# Patient Record
Sex: Female | Born: 1975 | Race: Black or African American | Hispanic: No | Marital: Single | Smoking: Never smoker
Health system: Southern US, Community
[De-identification: ages and names within clinical notes are randomized; demographics above are authoritative.]

## PROBLEM LIST (undated history)

## (undated) ENCOUNTER — Inpatient Hospital Stay (HOSPITAL_COMMUNITY): Payer: Self-pay

## (undated) DIAGNOSIS — R7989 Other specified abnormal findings of blood chemistry: Secondary | ICD-10-CM

## (undated) DIAGNOSIS — O039 Complete or unspecified spontaneous abortion without complication: Secondary | ICD-10-CM

## (undated) DIAGNOSIS — D219 Benign neoplasm of connective and other soft tissue, unspecified: Secondary | ICD-10-CM

## (undated) DIAGNOSIS — K602 Anal fissure, unspecified: Secondary | ICD-10-CM

## (undated) DIAGNOSIS — J45909 Unspecified asthma, uncomplicated: Secondary | ICD-10-CM

## (undated) DIAGNOSIS — F419 Anxiety disorder, unspecified: Secondary | ICD-10-CM

## (undated) DIAGNOSIS — O3432 Maternal care for cervical incompetence, second trimester: Secondary | ICD-10-CM

## (undated) DIAGNOSIS — E119 Type 2 diabetes mellitus without complications: Secondary | ICD-10-CM

## (undated) DIAGNOSIS — E282 Polycystic ovarian syndrome: Secondary | ICD-10-CM

## (undated) DIAGNOSIS — I471 Supraventricular tachycardia, unspecified: Secondary | ICD-10-CM

## (undated) DIAGNOSIS — Z8632 Personal history of gestational diabetes: Secondary | ICD-10-CM

## (undated) DIAGNOSIS — A749 Chlamydial infection, unspecified: Secondary | ICD-10-CM

## (undated) DIAGNOSIS — O4690 Antepartum hemorrhage, unspecified, unspecified trimester: Secondary | ICD-10-CM

## (undated) HISTORY — DX: Unspecified asthma, uncomplicated: J45.909

## (undated) HISTORY — DX: Anal fissure, unspecified: K60.2

## (undated) HISTORY — DX: Type 2 diabetes mellitus without complications: E11.9

## (undated) HISTORY — DX: Benign neoplasm of connective and other soft tissue, unspecified: D21.9

## (undated) HISTORY — DX: Chlamydial infection, unspecified: A74.9

## (undated) HISTORY — DX: Complete or unspecified spontaneous abortion without complication: O03.9

## (undated) HISTORY — DX: Other specified abnormal findings of blood chemistry: R79.89

## (undated) HISTORY — DX: Supraventricular tachycardia, unspecified: I47.10

## (undated) HISTORY — DX: Personal history of gestational diabetes: Z86.32

---
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
## 2001-01-22 ENCOUNTER — Emergency Department (HOSPITAL_COMMUNITY): Admission: EM | Admit: 2001-01-22 | Discharge: 2001-01-23 | Payer: Self-pay | Admitting: Emergency Medicine

## 2001-03-24 ENCOUNTER — Emergency Department (HOSPITAL_COMMUNITY): Admission: EM | Admit: 2001-03-24 | Discharge: 2001-03-24 | Payer: Self-pay | Admitting: Emergency Medicine

## 2001-05-11 ENCOUNTER — Encounter: Payer: Self-pay | Admitting: Family Medicine

## 2001-05-11 ENCOUNTER — Ambulatory Visit (HOSPITAL_COMMUNITY): Admission: RE | Admit: 2001-05-11 | Discharge: 2001-05-11 | Payer: Self-pay | Admitting: Family Medicine

## 2002-03-06 ENCOUNTER — Other Ambulatory Visit: Admission: RE | Admit: 2002-03-06 | Discharge: 2002-03-06 | Payer: Self-pay

## 2002-07-08 ENCOUNTER — Ambulatory Visit (HOSPITAL_COMMUNITY): Admission: RE | Admit: 2002-07-08 | Discharge: 2002-07-08 | Payer: Self-pay

## 2003-06-02 ENCOUNTER — Inpatient Hospital Stay (HOSPITAL_COMMUNITY): Admission: AD | Admit: 2003-06-02 | Discharge: 2003-06-02 | Payer: Self-pay | Admitting: Obstetrics and Gynecology

## 2003-06-07 ENCOUNTER — Other Ambulatory Visit: Admission: RE | Admit: 2003-06-07 | Discharge: 2003-06-07 | Payer: Self-pay | Admitting: Obstetrics and Gynecology

## 2003-08-24 ENCOUNTER — Encounter: Admission: RE | Admit: 2003-08-24 | Discharge: 2003-08-24 | Payer: Self-pay | Admitting: Gynecology

## 2003-09-06 ENCOUNTER — Inpatient Hospital Stay (HOSPITAL_COMMUNITY): Admission: AD | Admit: 2003-09-06 | Discharge: 2003-09-06 | Payer: Self-pay | Admitting: Gynecology

## 2003-10-16 ENCOUNTER — Observation Stay (HOSPITAL_COMMUNITY): Admission: AD | Admit: 2003-10-16 | Discharge: 2003-10-17 | Payer: Self-pay | Admitting: Gynecology

## 2003-10-31 DIAGNOSIS — O24419 Gestational diabetes mellitus in pregnancy, unspecified control: Secondary | ICD-10-CM

## 2003-11-18 ENCOUNTER — Inpatient Hospital Stay (HOSPITAL_COMMUNITY): Admission: AD | Admit: 2003-11-18 | Discharge: 2003-11-18 | Payer: Self-pay | Admitting: Gynecology

## 2003-11-19 ENCOUNTER — Ambulatory Visit (HOSPITAL_COMMUNITY): Admission: RE | Admit: 2003-11-19 | Discharge: 2003-11-19 | Payer: Self-pay | Admitting: Gynecology

## 2003-11-21 ENCOUNTER — Inpatient Hospital Stay (HOSPITAL_COMMUNITY): Admission: AD | Admit: 2003-11-21 | Discharge: 2003-11-24 | Payer: Self-pay | Admitting: Obstetrics and Gynecology

## 2004-02-02 ENCOUNTER — Other Ambulatory Visit: Admission: RE | Admit: 2004-02-02 | Discharge: 2004-02-02 | Payer: Self-pay | Admitting: Gynecology

## 2005-08-09 ENCOUNTER — Other Ambulatory Visit: Admission: RE | Admit: 2005-08-09 | Discharge: 2005-08-09 | Payer: Self-pay | Admitting: Gynecology

## 2006-08-12 ENCOUNTER — Other Ambulatory Visit: Admission: RE | Admit: 2006-08-12 | Discharge: 2006-08-12 | Payer: Self-pay | Admitting: Gynecology

## 2006-08-24 ENCOUNTER — Emergency Department (HOSPITAL_COMMUNITY): Admission: EM | Admit: 2006-08-24 | Discharge: 2006-08-25 | Payer: Self-pay | Admitting: Emergency Medicine

## 2006-08-26 ENCOUNTER — Emergency Department (HOSPITAL_COMMUNITY): Admission: EM | Admit: 2006-08-26 | Discharge: 2006-08-26 | Payer: Self-pay | Admitting: Emergency Medicine

## 2006-09-10 ENCOUNTER — Emergency Department (HOSPITAL_COMMUNITY): Admission: EM | Admit: 2006-09-10 | Discharge: 2006-09-11 | Payer: Self-pay | Admitting: Emergency Medicine

## 2007-07-03 DIAGNOSIS — A749 Chlamydial infection, unspecified: Secondary | ICD-10-CM

## 2007-07-03 HISTORY — DX: Chlamydial infection, unspecified: A74.9

## 2007-08-15 ENCOUNTER — Other Ambulatory Visit: Admission: RE | Admit: 2007-08-15 | Discharge: 2007-08-15 | Payer: Self-pay | Admitting: Gynecology

## 2008-07-28 ENCOUNTER — Ambulatory Visit: Payer: Self-pay | Admitting: Women's Health

## 2008-08-23 ENCOUNTER — Other Ambulatory Visit: Admission: RE | Admit: 2008-08-23 | Discharge: 2008-08-23 | Payer: Self-pay | Admitting: Gynecology

## 2008-08-23 ENCOUNTER — Ambulatory Visit: Payer: Self-pay | Admitting: Gynecology

## 2008-08-23 ENCOUNTER — Encounter: Payer: Self-pay | Admitting: Gynecology

## 2009-01-12 ENCOUNTER — Emergency Department (HOSPITAL_COMMUNITY): Admission: EM | Admit: 2009-01-12 | Discharge: 2009-01-12 | Payer: Self-pay | Admitting: Emergency Medicine

## 2009-02-25 ENCOUNTER — Ambulatory Visit: Payer: Self-pay | Admitting: Gynecology

## 2009-03-03 ENCOUNTER — Ambulatory Visit: Payer: Self-pay | Admitting: Gynecology

## 2009-08-29 ENCOUNTER — Ambulatory Visit: Payer: Self-pay | Admitting: Gynecology

## 2009-08-29 ENCOUNTER — Other Ambulatory Visit: Admission: RE | Admit: 2009-08-29 | Discharge: 2009-08-29 | Payer: Self-pay | Admitting: Gynecology

## 2010-02-21 ENCOUNTER — Ambulatory Visit: Payer: Self-pay | Admitting: Gynecology

## 2010-07-03 ENCOUNTER — Emergency Department (HOSPITAL_COMMUNITY)
Admission: EM | Admit: 2010-07-03 | Discharge: 2010-07-03 | Payer: Self-pay | Source: Home / Self Care | Admitting: Emergency Medicine

## 2010-07-13 ENCOUNTER — Ambulatory Visit
Admission: RE | Admit: 2010-07-13 | Discharge: 2010-07-13 | Payer: Self-pay | Source: Home / Self Care | Attending: Gynecology | Admitting: Gynecology

## 2010-07-14 ENCOUNTER — Ambulatory Visit
Admission: RE | Admit: 2010-07-14 | Discharge: 2010-07-14 | Payer: Self-pay | Source: Home / Self Care | Attending: Gynecology | Admitting: Gynecology

## 2010-07-22 ENCOUNTER — Encounter: Payer: Self-pay | Admitting: Obstetrics and Gynecology

## 2010-08-28 ENCOUNTER — Ambulatory Visit (INDEPENDENT_AMBULATORY_CARE_PROVIDER_SITE_OTHER): Payer: BC Managed Care – PPO | Admitting: Gynecology

## 2010-08-28 DIAGNOSIS — B373 Candidiasis of vulva and vagina: Secondary | ICD-10-CM

## 2010-08-28 DIAGNOSIS — N898 Other specified noninflammatory disorders of vagina: Secondary | ICD-10-CM

## 2010-08-28 DIAGNOSIS — R82998 Other abnormal findings in urine: Secondary | ICD-10-CM

## 2010-08-31 ENCOUNTER — Encounter (INDEPENDENT_AMBULATORY_CARE_PROVIDER_SITE_OTHER): Payer: BC Managed Care – PPO | Admitting: Gynecology

## 2010-08-31 ENCOUNTER — Inpatient Hospital Stay (HOSPITAL_COMMUNITY): Admit: 2010-08-31 | Payer: Self-pay

## 2010-08-31 ENCOUNTER — Other Ambulatory Visit: Payer: Self-pay | Admitting: Gynecology

## 2010-08-31 DIAGNOSIS — Z124 Encounter for screening for malignant neoplasm of cervix: Secondary | ICD-10-CM | POA: Insufficient documentation

## 2010-08-31 DIAGNOSIS — B373 Candidiasis of vulva and vagina: Secondary | ICD-10-CM

## 2010-08-31 DIAGNOSIS — Z01419 Encounter for gynecological examination (general) (routine) without abnormal findings: Secondary | ICD-10-CM

## 2010-09-01 ENCOUNTER — Other Ambulatory Visit (HOSPITAL_COMMUNITY)
Admission: RE | Admit: 2010-09-01 | Discharge: 2010-09-01 | Disposition: A | Discharge status: Home / Self Care | Payer: BC Managed Care – PPO | Source: Ambulatory Visit | Attending: Gynecology | Admitting: Gynecology

## 2010-09-13 ENCOUNTER — Emergency Department (HOSPITAL_COMMUNITY)
Admission: EM | Admit: 2010-09-13 | Discharge: 2010-09-13 | Disposition: A | Discharge status: Home / Self Care | Payer: BC Managed Care – PPO | Attending: Emergency Medicine | Admitting: Emergency Medicine

## 2010-09-13 DIAGNOSIS — F410 Panic disorder [episodic paroxysmal anxiety] without agoraphobia: Secondary | ICD-10-CM | POA: Insufficient documentation

## 2010-09-13 DIAGNOSIS — T50901A Poisoning by unspecified drugs, medicaments and biological substances, accidental (unintentional), initial encounter: Secondary | ICD-10-CM | POA: Insufficient documentation

## 2010-09-13 DIAGNOSIS — I498 Other specified cardiac arrhythmias: Secondary | ICD-10-CM | POA: Insufficient documentation

## 2010-09-13 DIAGNOSIS — R059 Cough, unspecified: Secondary | ICD-10-CM | POA: Insufficient documentation

## 2010-09-13 DIAGNOSIS — R062 Wheezing: Secondary | ICD-10-CM | POA: Insufficient documentation

## 2010-09-13 DIAGNOSIS — R05 Cough: Secondary | ICD-10-CM | POA: Insufficient documentation

## 2010-09-13 LAB — URINALYSIS, ROUTINE W REFLEX MICROSCOPIC
Bilirubin Urine: NEGATIVE
Glucose, UA: NEGATIVE mg/dL
Hgb urine dipstick: NEGATIVE
Ketones, ur: NEGATIVE mg/dL
Nitrite: NEGATIVE
Protein, ur: NEGATIVE mg/dL
Specific Gravity, Urine: 1.028 (ref 1.005–1.030)
Urobilinogen, UA: 0.2 mg/dL (ref 0.0–1.0)
pH: 6 (ref 5.0–8.0)

## 2010-09-13 LAB — POCT PREGNANCY, URINE: Preg Test, Ur: NEGATIVE

## 2010-10-08 LAB — BASIC METABOLIC PANEL
Creatinine, Ser: 0.79 mg/dL (ref 0.4–1.2)
GFR calc Af Amer: >60 mL/min (ref >60)
Glucose, Bld: 94 mg/dL (ref 70–99)
Potassium: 4.2 mEq/L (ref 3.5–5.1)
Sodium: 139 mEq/L (ref 135–145)

## 2010-10-08 LAB — CBC
MCHC: 32.8 g/dL (ref 30.0–36.0)
MCV: 88 fL (ref 78.0–100.0)
Platelets: 225 10*3/uL (ref 150–400)
RDW: 13 % (ref 11.5–15.5)

## 2010-10-08 LAB — DIFFERENTIAL
Basophils Relative: 1 % (ref 0–1)
Eosinophils Relative: 1 % (ref 0–5)
Lymphocytes Relative: 34 % (ref 12–46)
Lymphs Abs: 2.8 10*3/uL (ref 0.7–4.0)
Monocytes Relative: 6 % (ref 3–12)

## 2010-10-08 LAB — POCT CARDIAC MARKERS: CKMB, poc: <1 ng/mL — ABNORMAL LOW (ref 1.0–8.0)

## 2010-10-17 ENCOUNTER — Ambulatory Visit (INDEPENDENT_AMBULATORY_CARE_PROVIDER_SITE_OTHER): Payer: BC Managed Care – PPO | Admitting: Gynecology

## 2010-10-17 DIAGNOSIS — R51 Headache: Secondary | ICD-10-CM

## 2010-10-17 DIAGNOSIS — R5381 Other malaise: Secondary | ICD-10-CM

## 2010-10-17 DIAGNOSIS — N949 Unspecified condition associated with female genital organs and menstrual cycle: Secondary | ICD-10-CM

## 2010-10-17 DIAGNOSIS — N938 Other specified abnormal uterine and vaginal bleeding: Secondary | ICD-10-CM

## 2010-10-23 ENCOUNTER — Other Ambulatory Visit: Payer: BC Managed Care – PPO

## 2010-10-23 ENCOUNTER — Ambulatory Visit (INDEPENDENT_AMBULATORY_CARE_PROVIDER_SITE_OTHER): Payer: BC Managed Care – PPO | Admitting: Gynecology

## 2010-10-23 DIAGNOSIS — N920 Excessive and frequent menstruation with regular cycle: Secondary | ICD-10-CM

## 2010-10-23 DIAGNOSIS — N938 Other specified abnormal uterine and vaginal bleeding: Secondary | ICD-10-CM

## 2010-10-23 DIAGNOSIS — R1900 Intra-abdominal and pelvic swelling, mass and lump, unspecified site: Secondary | ICD-10-CM

## 2010-10-23 DIAGNOSIS — N949 Unspecified condition associated with female genital organs and menstrual cycle: Secondary | ICD-10-CM

## 2010-11-17 NOTE — H&P (Signed)
NAME:  Marie Wang, Marie Wang                        ACCOUNT NO.:  [id number]   MEDICAL RECORD NO.:  [id number]                   PATIENT TYPE:  OUT   LOCATION:  ULT                                  FACILITY:  [facility]   PHYSICIAN:  Ivor Costa. Farrel Gobble, M.D.              DATE OF BIRTH:  1976-05-23   DATE OF ADMISSION:  11/19/2003  DATE OF DISCHARGE:                                HISTORY & PHYSICAL   CHIEF COMPLAINT:  Low AFI, nonspecific abdominal pain.   HISTORY OF PRESENT ILLNESS:  The patient is a 35 year old G1 with an  estimated date of confinement of 12/11/2003 and estimated gestational age of  [redacted] weeks with the history of A1 diabetes that is well controlled and  fibroids.  The patient was admitted earlier in the pregnancy for nonspecific  right lower quadrant pain which she felt may have been related to the  fibroids; however, it has not changed.  She was noted to be contracting, at  that point, and though successfully tocolyzed her pain never got better.  Because the patient continues to have pain we had offered her an early  induction of labor after an amniocentesis for lung maturity.  The patient  actually presented to the office on 11/18/2003 for a routine OB visit and  was noted to be contracting. She was now 2 cm, 50%, minus 3 and posterior.  The patient elected to walk and return back to the office several hours  later and felt that this should make any change in her cervix.  Our original  plan was to have an induction for Sunday night after an amniocentesis and  she presented to the hospital on 05/20 in the a.m. for an amniocentesis.  She was noted, unfortunately, to have a very low AFI of 5.9, with a single  pocket at the fetal head.  The remainder of the biophysical profile was  assuring with a total score of 6/8 and minus 2 only for the fluid.  We will,  therefore, go ahead and proceed with out induction of labor without an  amniocentesis secondary to low AFI of unknown etiology.  There is no history  suspicious for rupture.   The pregnancy has been complicated by the fibroids, diabetes, and the  nonspecific right lower quadrant pain.  Her blood type is O positive; and  she is antibody negative; RPR nonreactive; rubella immune; Hepatitis B  surface antigen nonreactive; HIV nonreactive; hemoglobin electrophoresis was  normal.  Please refer to the __________.   PHYSICAL EXAMINATION:  GENERAL:  On physical examination she is a well-  appearing gravida in no acute distress.  HEART:  Her heart was regular rate.  LUNGS:  Her lungs were clear to auscultation.  ABDOMEN:  Her abdomen was gravid and nontender.  PELVIC:  Vaginal exam was 250 minus 3.  EXTREMITIES:  Trace edema.   ASSESSMENT:  1. Low amniotic fluid index at 37 weeks.  2. Gestational diabetes mellitus well controlled.   PLAN:  For induction of labor.  The patient will present in the evening of  the 22nd.  She will receive Cervidil for further priming of her cervix and  begin high-dose Pitocin in the morning.                                              Ivor Costa. Farrel Gobble, M.D.   Leda Roys  D:  11/19/2003  T:  11/19/2003  Job:  [id number]

## 2010-11-17 NOTE — Consult Note (Signed)
NAME:  Marie Wang, Marie Wang                        ACCOUNT NO.:  [id number]   MEDICAL RECORD NO.:  [id number]                   PATIENT TYPE:  MAT   LOCATION:  [facility]                                 FACILITY:  [facility]   PHYSICIAN:  Juan H. Lily Peer, M.D.             DATE OF BIRTH:  1975/09/15   DATE OF CONSULTATION:  09/06/2003  DATE OF DISCHARGE:                                   CONSULTATION   REASON FOR CONSULTATION:  The patient is a 35 year old gravida 1 para 0 at  9 and one-seventh weeks gestation who had been complaining for the past few  days of nausea, vomiting, and diarrhea and was not able to keep any food  down; she stated her temperature had been over 100 at home; and she was  asked to come in to maternity admissions.  The patient stated for several  weeks she had been treated for a urinary tract infection on and off but she  has not been able to keep her Macrobid.  When she was brought in fetal heart  tones were appreciated.  She was not having any labor.  Her temperature is  98.1 and her pulse was 90, respirations were 20, and blood pressure 113/52.  She was started on lactated Ringers 500 mL bolus followed by 200 mL/hour and  in that liter of IV fluids she was given Reglan 10 mg to help with her  nausea and vomiting and she was given an Imodium for her loose stool.   PHYSICAL EXAMINATION:  GENERAL:  The patient did not appear to be in any  acute distress.  HEENT:  Unremarkable.  NECK:  Supple, trachea midline, no carotid bruits, no thyromegaly.  LUNGS:  Clear to auscultation without rhonchi or wheezes.  HEART:  Regular rate and rhythm, no murmurs or gallops.  BREAST:  Not done.  ABDOMEN:  Soft, nontender, with no rebound or guarding.  PELVIC:  Not done.  EXTREMITIES:  Trace edema.   LABORATORY DATA:  Her comprehensive metabolic panel was normal.  Her CBC was  normal with a white blood count of 10.9; hemoglobin and hematocrit of 12.2  and 37.6 respectively; with a platelet  count of 242,000.  Her urinalysis had  7-10 wbc's and few bacteria and specific gravity was 1.025 and leukocyte  esterase was small.   Based on these findings and the fact that the patient was not able to keep  her antibiotic we went ahead since we had an IV access and gave her 3 g of  Unasyn IV and the urine was sent for culture and sensitivity. Upon  completion of her IV fluid the patient had been tolerating regular diet in  the emergency room and was prepared to be discharged home.  She was  instructed to stay on clear liquids for 24 hours, to take Imodium on a  p.r.n. basis, and she was given a prescription for Reglan 10 mg to  take one  p.o. q.4-6h. p.r.n. and a note to stay out of work for the next couple of  days.  It appears the patient had an underlying viral gastroenteritis and  appears to be resolving.  She is scheduled to return back to the office next  week and we will notify her if there is any abnormality on the urine culture  and sensitivity and even we may need to change her antibiotic.                                               Juan H. Lily Peer, M.D.    JHF/MEDQ  D:  09/06/2003  T:  09/06/2003  Job:  [id number]

## 2010-11-24 ENCOUNTER — Ambulatory Visit (INDEPENDENT_AMBULATORY_CARE_PROVIDER_SITE_OTHER): Payer: BC Managed Care – PPO | Admitting: Gynecology

## 2010-11-24 DIAGNOSIS — Z30431 Encounter for routine checking of intrauterine contraceptive device: Secondary | ICD-10-CM

## 2010-11-28 ENCOUNTER — Ambulatory Visit (INDEPENDENT_AMBULATORY_CARE_PROVIDER_SITE_OTHER): Payer: BC Managed Care – PPO | Admitting: Gynecology

## 2010-11-28 ENCOUNTER — Other Ambulatory Visit: Payer: Self-pay | Admitting: Family Medicine

## 2010-11-28 ENCOUNTER — Ambulatory Visit: Payer: BC Managed Care – PPO | Admitting: Gynecology

## 2010-11-28 DIAGNOSIS — K1120 Sialoadenitis, unspecified: Secondary | ICD-10-CM

## 2010-11-28 DIAGNOSIS — N920 Excessive and frequent menstruation with regular cycle: Secondary | ICD-10-CM

## 2010-11-29 ENCOUNTER — Ambulatory Visit
Admission: RE | Admit: 2010-11-29 | Discharge: 2010-11-29 | Disposition: A | Discharge status: Home / Self Care | Payer: BC Managed Care – PPO | Source: Ambulatory Visit | Attending: Family Medicine | Admitting: Family Medicine

## 2010-11-29 DIAGNOSIS — K1120 Sialoadenitis, unspecified: Secondary | ICD-10-CM

## 2010-12-08 ENCOUNTER — Encounter (INDEPENDENT_AMBULATORY_CARE_PROVIDER_SITE_OTHER): Payer: BC Managed Care – PPO | Admitting: Gynecology

## 2010-12-08 DIAGNOSIS — N949 Unspecified condition associated with female genital organs and menstrual cycle: Secondary | ICD-10-CM

## 2010-12-08 DIAGNOSIS — N938 Other specified abnormal uterine and vaginal bleeding: Secondary | ICD-10-CM

## 2011-01-11 NOTE — Progress Notes (Signed)
This encounter was created in error - please disregard.

## 2011-02-08 ENCOUNTER — Ambulatory Visit (INDEPENDENT_AMBULATORY_CARE_PROVIDER_SITE_OTHER): Payer: BC Managed Care – PPO | Admitting: Gynecology

## 2011-02-08 ENCOUNTER — Ambulatory Visit: Payer: BC Managed Care – PPO | Admitting: Gynecology

## 2011-02-08 ENCOUNTER — Emergency Department (HOSPITAL_COMMUNITY)
Admission: EM | Admit: 2011-02-08 | Discharge: 2011-02-08 | Disposition: A | Discharge status: Home / Self Care | Payer: BC Managed Care – PPO | Attending: Emergency Medicine | Admitting: Emergency Medicine

## 2011-02-08 ENCOUNTER — Encounter: Payer: Self-pay | Admitting: Gynecology

## 2011-02-08 ENCOUNTER — Emergency Department (HOSPITAL_COMMUNITY): Payer: BC Managed Care – PPO

## 2011-02-08 DIAGNOSIS — N949 Unspecified condition associated with female genital organs and menstrual cycle: Secondary | ICD-10-CM

## 2011-02-08 DIAGNOSIS — N739 Female pelvic inflammatory disease, unspecified: Secondary | ICD-10-CM | POA: Insufficient documentation

## 2011-02-08 DIAGNOSIS — R112 Nausea with vomiting, unspecified: Secondary | ICD-10-CM | POA: Insufficient documentation

## 2011-02-08 DIAGNOSIS — R109 Unspecified abdominal pain: Secondary | ICD-10-CM | POA: Insufficient documentation

## 2011-02-08 DIAGNOSIS — Z309 Encounter for contraceptive management, unspecified: Secondary | ICD-10-CM

## 2011-02-08 DIAGNOSIS — R102 Pelvic and perineal pain: Secondary | ICD-10-CM

## 2011-02-08 LAB — URINE MICROSCOPIC-ADD ON

## 2011-02-08 LAB — DIFFERENTIAL
Eosinophils Relative: 1 % (ref 0–5)
Lymphocytes Relative: 31 % (ref 12–46)
Monocytes Absolute: 0.6 10*3/uL (ref 0.1–1.0)
Neutro Abs: 4.9 10*3/uL (ref 1.7–7.7)

## 2011-02-08 LAB — COMPREHENSIVE METABOLIC PANEL
ALT: 14 U/L (ref 0–35)
BUN: 7 mg/dL (ref 6–23)
CO2: 24 mEq/L (ref 19–32)
Calcium: 9.3 mg/dL (ref 8.4–10.5)
Creatinine, Ser: 0.78 mg/dL (ref 0.50–1.10)
Glucose, Bld: 97 mg/dL (ref 70–99)
Total Protein: 7.3 g/dL (ref 6.0–8.3)

## 2011-02-08 LAB — CBC
MCH: 28.7 pg (ref 26.0–34.0)
MCHC: 33.1 g/dL (ref 30.0–36.0)
MCV: 86.8 fL (ref 78.0–100.0)
RDW: 12.5 % (ref 11.5–15.5)
WBC: 8.1 10*3/uL (ref 4.0–10.5)

## 2011-02-08 LAB — WET PREP, GENITAL: Yeast Wet Prep HPF POC: NONE SEEN

## 2011-02-08 LAB — URINALYSIS, ROUTINE W REFLEX MICROSCOPIC
Bilirubin Urine: NEGATIVE
Ketones, ur: NEGATIVE mg/dL
Nitrite: NEGATIVE
Specific Gravity, Urine: 1.014 (ref 1.005–1.030)
Urobilinogen, UA: 1 mg/dL (ref 0.0–1.0)

## 2011-02-08 MED ORDER — NORGESTIMATE-ETH ESTRADIOL 0.25-35 MG-MCG PO TABS: 1.0000 | ORAL_TABLET | Freq: Every day | ORAL | Status: DC

## 2011-02-08 NOTE — Progress Notes (Signed)
Patient presents having awoken this morning with some chills and having some lower pelvic discomfort. She presented to the emergency room at [facility] and she was diagnosed with PID. She was given an IM antibiotic and then 2 prescriptions for doxycycline 100 mg twice a day and Flagyl 500 twice a day. She was uncomfortable with the diagnosis of PID and she presents here for evaluation. On review of was [facility] notes she had a normal white count in the 8 range, contaminated urinalysis, wet prep showing some clue cells and negative UPT, negative comprehensive metabolic panel and negative abdominal x-ray series. Patient notes that she is feeling better since this morning. She feels basically now like her menses wants to start. Historically she had an IUD was removed in May, she was placed on Loestrin 120 had continued breakthrough bleeding and then was placed on Sprintec she decided to do an every two-month withdrawal with this and she is going into her second pack of continuous pills.  Exam Abdomen: Soft nontender without masses guarding rebound organomegaly Pelvic: External BUS vagina normal cervix normal uterus normal size midline mobile nontender adnexa without masses or tenderness  Assessment and plan: Lower abdominal discomfort now resolving. I doubt that she had PID based on normal white count a normal exam. She has not even started the doxycycline or Flagyl and did receive one dose of antibiotic IM which I suspected was ceftriaxone. As her wet prep did show clue cells I recommend she complete the Flagyl 500 twice a day prescription and go ahead and take the Vibramycin also. She states that they did do cultures although I did not see them listed on my review she was sure that they did a GC and Chlamydia screen she'll follow up for those results also. I recommended that she go ahead and stop her pills now and then restart them next week and and do an every month withdrawal  and see how her cycle goes.  The need to use condoms regardless as a backup for contraception was stressed and she agrees with this. If she has continuing lower, pain fever chills or any other symptoms she will present for further evaluation.  I refilled her Sprintec x8 to get her in to the beginning of next year when she is due for her annual.

## 2011-02-09 LAB — GC/CHLAMYDIA PROBE AMP, GENITAL: GC Probe Amp, Genital: NEGATIVE

## 2011-03-03 ENCOUNTER — Telehealth: Payer: Self-pay | Admitting: Gynecology

## 2011-03-03 NOTE — Telephone Encounter (Signed)
BCP'S not at pharmacy.  Mononessa X 8 refills called to [facility].  Pt asked to call office and update pharmacy listed to allow for E prescribing.

## 2011-04-27 ENCOUNTER — Encounter: Payer: Self-pay | Admitting: Gynecology

## 2011-04-27 ENCOUNTER — Telehealth: Payer: Self-pay | Admitting: *Deleted

## 2011-04-27 ENCOUNTER — Ambulatory Visit (INDEPENDENT_AMBULATORY_CARE_PROVIDER_SITE_OTHER): Payer: BC Managed Care – PPO | Admitting: Gynecology

## 2011-04-27 VITALS — BP 130/70

## 2011-04-27 DIAGNOSIS — N926 Irregular menstruation, unspecified: Secondary | ICD-10-CM

## 2011-04-27 MED ORDER — MEGESTROL ACETATE 20 MG PO TABS: 20.0000 mg | ORAL_TABLET | Freq: Every day | ORAL | Status: AC

## 2011-04-27 NOTE — Telephone Encounter (Signed)
Pt informed with the below note. Left message on pt voice mail.

## 2011-04-27 NOTE — Telephone Encounter (Signed)
There are a lot of different possibilities and she really needs to be seen for the correct diagnosis

## 2011-04-27 NOTE — Progress Notes (Signed)
Patient presents having started her periods 10 days ago and this has bled continuously since then passing clots.  She's a history of irregular bleeding with an IUD ultimately this was removed in the spring. She had ultrasound that was normal although the IUD was in place at that time. She suffers was started on Junel  BCPs bled continuously and then was switched to Sprintec she did well for 2 packs but now has been bleeding. This was again at the end of the pack when her regular period started but then has just continued to bleed.  Exam Pelvic external BUS vagina with moderate menstrual flow, cervix grossly normal, uterus normal size midline mobile nontender adnexa without masses or tenderness  Assessment and plan: Irregular menses menorrhagia. Patient has restarted her pills and she'll continue on these I added Megace 20 mg daily until her bleeding subsides. I scheduled sonohysterogram baseline CBC TSH and hCG. Options for management were reviewed with areas possibilities. After ruling out intracavitary abnormalities we did try a different pill, retry Mirena IUD consider ablation although she would need contraception up to and including possible hysterectomy. If intracavitary abnormalities and we'll discuss hysteroscopy resection.  She will think of her options and follow up for the sonohysterogram and we'll go from there.

## 2011-04-27 NOTE — Telephone Encounter (Signed)
Pt called c/o bleeding x 10 days now, LMP:04/18/11 and bleeding since then. Pt states bleeding heavy for her 4 pads per day. She noted that this has happened in the pass and JF gave her RX to stop the bleeding. She made appointment for today at 3:30pm but pt states she really cant take off for work. last office visit 02/08/11  Please advise

## 2011-05-11 ENCOUNTER — Ambulatory Visit (INDEPENDENT_AMBULATORY_CARE_PROVIDER_SITE_OTHER): Payer: BC Managed Care – PPO

## 2011-05-11 ENCOUNTER — Ambulatory Visit (INDEPENDENT_AMBULATORY_CARE_PROVIDER_SITE_OTHER): Payer: BC Managed Care – PPO | Admitting: Gynecology

## 2011-05-11 ENCOUNTER — Encounter: Payer: Self-pay | Admitting: Gynecology

## 2011-05-11 DIAGNOSIS — N926 Irregular menstruation, unspecified: Secondary | ICD-10-CM

## 2011-05-11 DIAGNOSIS — D259 Leiomyoma of uterus, unspecified: Secondary | ICD-10-CM

## 2011-05-11 DIAGNOSIS — N949 Unspecified condition associated with female genital organs and menstrual cycle: Secondary | ICD-10-CM

## 2011-05-11 DIAGNOSIS — N938 Other specified abnormal uterine and vaginal bleeding: Secondary | ICD-10-CM

## 2011-05-11 MED ORDER — NORGESTIM-ETH ESTRAD TRIPHASIC 0.18/0.215/0.25 MG-35 MCG PO TABS: 1.0000 | ORAL_TABLET | Freq: Every day | ORAL | Status: DC

## 2011-05-11 NOTE — Progress Notes (Signed)
Patient presents for sonohysterogram due to persistent DUB. 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 Right and left ovaries normal. Endometrial echo of 5.4 mm. She is at the end of Megace as she's doing just occasional spotting. Sonohysterogram was performed sterile technique easy catheter introduction was relatively poor distention. There were no abnormalities seen and an endometrial biopsy was taken. The patient tolerated well.  Discussed situation. She had a trial of IUD which she did not tolerate due to persistent bleeding restarted on Loestrin 120 equivalents continue to have breakthrough bleeding was switched to Sprintec did well for 2 packs per day and had irregular bleeding. Patient really liked being on the pills, we've discussed alternatives as noted in my 04/14/2011 note.  She wants to try a different pill I prescribed tri-Sprintec and see if a triphasic doesn't help with this breakthrough bleeding. She went start this now follow up her biopsy results and then we'll go from there.

## 2011-06-08 ENCOUNTER — Encounter: Payer: Self-pay | Admitting: Gynecology

## 2011-07-03 DIAGNOSIS — D219 Benign neoplasm of connective and other soft tissue, unspecified: Secondary | ICD-10-CM

## 2011-07-03 HISTORY — DX: Benign neoplasm of connective and other soft tissue, unspecified: D21.9

## 2011-08-20 ENCOUNTER — Other Ambulatory Visit: Payer: Self-pay | Admitting: *Deleted

## 2011-09-11 ENCOUNTER — Emergency Department (HOSPITAL_COMMUNITY)
Admission: EM | Admit: 2011-09-11 | Discharge: 2011-09-11 | Disposition: A | Discharge status: Home / Self Care | Payer: BC Managed Care – PPO | Attending: Emergency Medicine | Admitting: Emergency Medicine

## 2011-09-11 ENCOUNTER — Encounter (HOSPITAL_COMMUNITY): Payer: Self-pay | Admitting: Emergency Medicine

## 2011-09-11 DIAGNOSIS — J019 Acute sinusitis, unspecified: Secondary | ICD-10-CM | POA: Insufficient documentation

## 2011-09-11 DIAGNOSIS — G43909 Migraine, unspecified, not intractable, without status migrainosus: Secondary | ICD-10-CM | POA: Insufficient documentation

## 2011-09-11 DIAGNOSIS — J329 Chronic sinusitis, unspecified: Secondary | ICD-10-CM

## 2011-09-11 MED ORDER — DOXYCYCLINE HYCLATE 100 MG PO TABS: 100.0000 mg | ORAL_TABLET | Freq: Two times a day (BID) | ORAL | Status: AC

## 2011-09-11 MED ORDER — METOCLOPRAMIDE HCL 5 MG/ML IJ SOLN
10.0000 mg | Freq: Once | INTRAMUSCULAR | Status: AC
  Administered 2011-09-11: 10 mg via INTRAVENOUS
  Filled 2011-09-11: qty 2

## 2011-09-11 MED ORDER — DIPHENHYDRAMINE HCL 50 MG/ML IJ SOLN
25.0000 mg | Freq: Once | INTRAMUSCULAR | Status: AC
  Administered 2011-09-11: 25 mg via INTRAVENOUS
  Filled 2011-09-11: qty 1

## 2011-09-11 MED ORDER — SODIUM CHLORIDE 0.9 % IV BOLUS (SEPSIS)
1000.0000 mL | Freq: Once | INTRAVENOUS | Status: AC
  Administered 2011-09-11: 1000 mL via INTRAVENOUS

## 2011-09-11 MED ORDER — DOXYCYCLINE HYCLATE 100 MG PO TABS
100.0000 mg | ORAL_TABLET | Freq: Once | ORAL | Status: AC
  Administered 2011-09-11: 100 mg via ORAL
  Filled 2011-09-11: qty 1

## 2011-09-11 MED ORDER — KETOROLAC TROMETHAMINE 30 MG/ML IJ SOLN
30.0000 mg | Freq: Once | INTRAMUSCULAR | Status: AC
  Administered 2011-09-11: 30 mg via INTRAVENOUS
  Filled 2011-09-11: qty 1

## 2011-09-11 NOTE — Discharge Instructions (Signed)
Migraine Headache A migraine headache is an intense, throbbing pain on one or both sides of your head. The exact cause of a migraine headache is not always known. A migraine may be caused when nerves in the brain become irritated and release chemicals that cause swelling within blood vessels, causing pain. Many migraine sufferers have a family history of migraines. Before you get a migraine you may or may not get an aura. An aura is a group of symptoms that can predict the beginning of a migraine. An aura may include:  Visual changes such as:   Flashing lights.   Bright spots or zig-zag lines.   Tunnel vision.   Feelings of numbness.   Trouble talking.   Muscle weakness.  SYMPTOMS  Pain on one or both sides of your head.   Pain that is pulsating or throbbing in nature.   Pain that is severe enough to prevent daily activities.   Pain that is aggravated by any daily physical activity.   Nausea (feeling sick to your stomach), vomiting, or both.   Pain with exposure to bright lights, loud noises, or activity.   General sensitivity to bright lights or loud noises.  MIGRAINE TRIGGERS Examples of triggers of migraine headaches include:   Alcohol.   Smoking.   Stress.   It may be related to menses (female menstruation).   Aged cheeses.   Foods or drinks that contain nitrates, glutamate, aspartame, or tyramine.   Lack of sleep.   Chocolate.   Caffeine.   Hunger.   Medications such as nitroglycerine (used to treat chest pain), birth control pills, estrogen, and some blood pressure medications.  DIAGNOSIS  A migraine headache is often diagnosed based on:  Symptoms.   Physical examination.   A computerized X-ray scan (computed tomography, CT) of your head.  TREATMENT  Medications can help prevent migraines if they are recurrent or should they become recurrent. Your caregiver can help you with a medication or treatment program that will be helpful to you.   Lying  down in a dark, quiet room may be helpful.   Keeping a headache diary may help you find a trend as to what may be triggering your headaches.  SEEK IMMEDIATE MEDICAL CARE IF:   You have confusion, personality changes or seizures.   You have headaches that wake you from sleep.   You have an increased frequency in your headaches.   You have a stiff neck.   You have a loss of vision.   You have muscle weakness.   You start losing your balance or have trouble walking.   You feel faint or pass out.  MAKE SURE YOU:   Understand these instructions.   Will watch your condition.   Will get help right away if you are not doing well or get worse.  Document Released: 06/18/2005 Document Revised: 06/07/2011 Document Reviewed: 02/01/2009 [employer] Patient Information 2012 [place], [place].  Sinusitis Sinuses are air pockets within the bones of your face. The growth of bacteria within a sinus leads to infection. The infection prevents the sinuses from draining. This infection is called sinusitis. SYMPTOMS  There will be different areas of pain depending on which sinuses have become infected.  The maxillary sinuses often produce pain beneath the eyes.   Frontal sinusitis may cause pain in the middle of the forehead and above the eyes.  Other problems (symptoms) include:  Toothaches.   Colored, pus-like (purulent) drainage from the nose.   Swelling, warmth, and tenderness over  the sinus areas may be signs of infection.  TREATMENT  Sinusitis is most often determined by an exam.X-rays may be taken. If x-rays have been taken, make sure you obtain your results or find out how you are to obtain them. Your caregiver may give you medications (antibiotics). These are medications that will help kill the bacteria causing the infection. You may also be given a medication (decongestant) that helps to reduce sinus swelling.  HOME CARE INSTRUCTIONS   Only take over-the-counter or prescription  medicines for pain, discomfort, or fever as directed by your caregiver.   Drink extra fluids. Fluids help thin the mucus so your sinuses can drain more easily.   Applying either moist heat or ice packs to the sinus areas may help relieve discomfort.   Use saline nasal sprays to help moisten your sinuses. The sprays can be found at your local drugstore.  SEEK IMMEDIATE MEDICAL CARE IF:  You have a fever.   You have increasing pain, severe headaches, or toothache.   You have nausea, vomiting, or drowsiness.   You develop unusual swelling around the face or trouble seeing.  MAKE SURE YOU:   Understand these instructions.   Will watch your condition.   Will get help right away if you are not doing well or get worse.  Document Released: 06/18/2005 Document Revised: 06/07/2011 Document Reviewed: 01/15/2007 [facility] Patient Information 2012 [place], [place].

## 2011-09-11 NOTE — ED Provider Notes (Signed)
History     CSN: [id number]  Arrival date & time 09/11/11  0865   First MD Initiated Contact with Patient 09/11/11 [phone]      Chief Complaint  Patient presents with  . Headache    (Consider location/radiation/quality/duration/timing/severity/associated sxs/prior treatment) HPI Comments: Also with sinus tenderness and congestion  Patient is a 36 y.o. female presenting with headaches. The history is provided by the patient. No language interpreter was used.  Headache  This is a new problem. The current episode started yesterday. The problem occurs constantly. The problem has been gradually worsening. The pain is located in the left unilateral region. The quality of the pain is described as throbbing. The pain is moderate. The pain does not radiate. Associated symptoms include malaise/fatigue, nausea and vomiting. Pertinent negatives include no anorexia, no fever, no chest pressure, no near-syncope, no orthopnea, no palpitations, no syncope and no shortness of breath. She has tried nothing for the symptoms. The treatment provided no relief.    Past Medical History  Diagnosis Date  . GDM (gestational diabetes mellitus)   . Chlamydia 2009    Past Surgical History  Procedure Date  . Mouth surgery 1996    Family History  Problem Relation Age of Onset  . Hypertension Mother   . Breast cancer Paternal Aunt   . Ovarian cancer Maternal Grandmother   . Diabetes Paternal Grandmother     History  Substance Use Topics  . Smoking status: Never Smoker   . Smokeless tobacco: Never Used  . Alcohol Use: Yes    OB History    Grav Para Term Preterm Abortions TAB SAB Ect Mult Living   2 1   1  1          Review of Systems  Constitutional: Positive for malaise/fatigue. Negative for fever, chills, activity change, appetite change and fatigue.  HENT: Positive for congestion and sinus pressure. Negative for sore throat, rhinorrhea, neck pain and neck stiffness.   Respiratory: Negative for  cough and shortness of breath.   Cardiovascular: Negative for chest pain, palpitations, orthopnea, syncope and near-syncope.  Gastrointestinal: Positive for nausea and vomiting. Negative for abdominal pain and anorexia.  Genitourinary: Negative for dysuria, urgency, frequency and flank pain.  Musculoskeletal: Negative for myalgias, back pain and arthralgias.  Neurological: Negative for dizziness, weakness, light-headedness, numbness and headaches.  All other systems reviewed and are negative.    Allergies  Codeine; Sulfa antibiotics; and Prednisone  Home Medications   Current Outpatient Rx  Name Route Sig Dispense Refill  . ESCITALOPRAM OXALATE 20 MG PO TABS Oral Take 20 mg by mouth daily.    . MULTIVITAMIN/IRON PO Oral Take 1 tablet by mouth daily.     Marland Kitchen NORGESTIM-ETH ESTRAD TRIPHASIC 0.18/0.215/0.25 MG-35 MCG PO TABS Oral Take 1 tablet by mouth daily. 1 Package 11  . DOXYCYCLINE HYCLATE 100 MG PO TABS Oral Take 1 tablet (100 mg total) by mouth 2 (two) times daily. 14 tablet 0    BP 136/74  Pulse 82  Temp(Src) 98.3 F (36.8 C) (Oral)  Resp 16  SpO2 100%  Physical Exam  Nursing note and vitals reviewed. Constitutional: She is oriented to person, place, and time. She appears well-developed and well-nourished. No distress.  HENT:  Head: Normocephalic and atraumatic.  Mouth/Throat: Oropharynx is clear and moist.       L maxillary sinus tenderness on palpation.  TMJ tenderness on L as well  Eyes: Conjunctivae and EOM are normal. Pupils are equal, round, and reactive to  light.  Neck: Normal range of motion. Neck supple.  Cardiovascular: Normal rate, regular rhythm, normal heart sounds and intact distal pulses.  Exam reveals no gallop and no friction rub.   No murmur heard. Pulmonary/Chest: Effort normal and breath sounds normal. No respiratory distress. She exhibits no tenderness.  Abdominal: Soft. Bowel sounds are normal. There is no tenderness.  Musculoskeletal: Normal range  of motion. She exhibits no tenderness.  Neurological: She is alert and oriented to person, place, and time. No cranial nerve deficit.  Skin: Skin is warm and dry. No rash noted.    ED Course  Procedures (including critical care time)  Labs Reviewed - No data to display No results found.   1. Migraine   2. Sinusitis       MDM  Headache consistent with a migraine headache with superimposed acute sinusitis. I have no concern about a malignant cause of headache such as subarachnoid hemorrhage or meningitis. Headache was gradual in onset. She has no neurologic symptoms. Headache resolved after a migraine cocktail. I also administered doxycycline emergency department for her first dose for sinusitis. Instructed to followup with her primary care physician. Return precautions were provided        Dayton Bailiff, MD 09/11/11 [phone]

## 2011-09-11 NOTE — ED Notes (Signed)
Per pt headache started yesterday, progressively got worse-vomited this am, sensitive to light

## 2011-09-24 ENCOUNTER — Encounter: Payer: Self-pay | Admitting: Gynecology

## 2011-09-24 ENCOUNTER — Ambulatory Visit (INDEPENDENT_AMBULATORY_CARE_PROVIDER_SITE_OTHER): Payer: BC Managed Care – PPO | Admitting: Gynecology

## 2011-09-24 VITALS — BP 122/76 | Ht 64.5 in | Wt 236.0 lb

## 2011-09-24 DIAGNOSIS — Z8632 Personal history of gestational diabetes: Secondary | ICD-10-CM | POA: Insufficient documentation

## 2011-09-24 DIAGNOSIS — N938 Other specified abnormal uterine and vaginal bleeding: Secondary | ICD-10-CM

## 2011-09-24 DIAGNOSIS — N926 Irregular menstruation, unspecified: Secondary | ICD-10-CM

## 2011-09-24 DIAGNOSIS — Z113 Encounter for screening for infections with a predominantly sexual mode of transmission: Secondary | ICD-10-CM

## 2011-09-24 DIAGNOSIS — Z01419 Encounter for gynecological examination (general) (routine) without abnormal findings: Secondary | ICD-10-CM

## 2011-09-24 MED ORDER — NORGESTIM-ETH ESTRAD TRIPHASIC 0.18/0.215/0.25 MG-35 MCG PO TABS: 1.0000 | ORAL_TABLET | Freq: Every day | ORAL | Status: DC

## 2011-09-24 NOTE — Patient Instructions (Signed)
Follow up for STD screening results. Follow up in one year for annual gynecologic exam.

## 2011-09-24 NOTE — Progress Notes (Signed)
Marie Wang October 29, 1975 [id number]        36 y.o.  for annual exam.  Several issues noted below.  Past medical history,surgical history, medications, allergies, family history and social history were all reviewed and documented in the EPIC chart. ROS:  Was performed and pertinent positives and negatives are included in the history.  Exam: Marie Wang chaperone present Filed Vitals:   09/24/11 1517  BP: 122/76   General appearance  Normal Skin grossly normal Head/Neck normal with no cervical or supraclavicular adenopathy thyroid normal Lungs  clear Cardiac RR, without RMG Abdominal  soft, nontender, without masses, organomegaly or hernia Breasts  examined lying and sitting without masses, retractions, discharge or axillary adenopathy. Pelvic  Ext/BUS/vagina  normal   Cervix  normal  GC Chlamydia screen done  Uterus  axial, normal size, shape and contour, midline and mobile nontender   Adnexa  Without masses or tenderness    Anus and perineum  normal   Rectovaginal  normal sphincter tone without palpated masses or tenderness.    Assessment/Plan:  36 y.o. female for annual exam.    1. STD screening. Patient requests STD screening. She has no known exposure but just wants to be screened. GC Chlamydia hepatitis B hepatitis C HIV RPR all done. 2. DUB/contraception. Patients on triphasic oral contraceptives and has done well with these with no irregular bleeding. She wants to continue. I reviewed the risks of birth control pills to include stroke heart attack DVT. Never smoked not being followed for any medical issues and I refilled her times a year. 3. Breast health. SBE monthly reviewed. Screening mammography between 35 and 40 discussed. She has no strong family history of first wait closer to 40. 4. Pap smear. No Pap smear was done today. Her last Pap smear was 2012. She has no history of abnormal Pap smears before I reviewed current screening guidelines we'll plan on every 3 or Pap  smears. 5. Health maintenance. No other blood work was done today as it's all done through her primary physician's office who she sees routinely. Assuming she continues well from a gynecologic standpoint and she will see me in a year, sooner as needed.    Marie Lords MD, 3:33 PM 09/24/2011

## 2011-09-25 LAB — GC/CHLAMYDIA PROBE AMP, GENITAL
Chlamydia, DNA Probe: NEGATIVE
GC Probe Amp, Genital: NEGATIVE

## 2011-09-25 LAB — RPR

## 2011-09-25 LAB — HEPATITIS C ANTIBODY: HCV Ab: NEGATIVE

## 2011-09-25 LAB — HIV ANTIBODY (ROUTINE TESTING W REFLEX): HIV: NONREACTIVE

## 2011-10-08 ENCOUNTER — Other Ambulatory Visit: Payer: Self-pay | Admitting: *Deleted

## 2011-10-08 MED ORDER — NORGESTIMATE-ETH ESTRADIOL 0.25-35 MG-MCG PO TABS: 1.0000 | ORAL_TABLET | Freq: Every day | ORAL | Status: DC

## 2011-10-08 NOTE — Telephone Encounter (Signed)
Received refill order from [facility] on Mononessa 28 tabs.  Last visit was rx's a triphasic pill.  Called patient and confirmed she is on Mononessa.  Will refill that and discontinue that rx.

## 2012-01-04 ENCOUNTER — Ambulatory Visit (INDEPENDENT_AMBULATORY_CARE_PROVIDER_SITE_OTHER): Payer: BC Managed Care – PPO | Admitting: Gynecology

## 2012-01-04 ENCOUNTER — Encounter: Payer: Self-pay | Admitting: Gynecology

## 2012-01-04 VITALS — BP 130/90

## 2012-01-04 DIAGNOSIS — N898 Other specified noninflammatory disorders of vagina: Secondary | ICD-10-CM

## 2012-01-04 DIAGNOSIS — Z113 Encounter for screening for infections with a predominantly sexual mode of transmission: Secondary | ICD-10-CM

## 2012-01-04 LAB — WET PREP FOR TRICH, YEAST, CLUE: Clue Cells Wet Prep HPF POC: NONE SEEN

## 2012-01-04 MED ORDER — METRONIDAZOLE 500 MG PO TABS: 500.0000 mg | ORAL_TABLET | Freq: Two times a day (BID) | ORAL | Status: AC

## 2012-01-04 NOTE — Progress Notes (Signed)
Patient is a 36 year old that presented to the office today complaining of vaginal discharge with pruritus. Patient stated that she has started back on the dating scene and then had intercourse with a new partner a month ago although she was using condoms was concerned because he had informed her that he has a past history of herpes. She did not have an active lesion at the time of intercourse and they were using condoms. Patient denied any lesions on her genitalia and wanted to have an STD screen. 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 Patient's currently also on oral contraceptive pills.  Exam: Bartholin urethra Skene glands: Within normal limits Vagina: No lesions or discharge Cervix: No lesions or discharge Bimanual exam: Not done Rectal exam: Not done  Wet prep was negative with the exception of moderate bacteria and moderate white blood cells  Assessment/plan: Patient will be prescribed Flagyl 500 mg twice a day for 5 days in the event of an early bacterial vaginosis. GC and Chlamydia culture along with HIV, RPR, hepatitis B and C. were tested today results pending at time of this dictation.

## 2012-01-05 LAB — HEPATITIS B SURFACE ANTIGEN: Hepatitis B Surface Ag: NEGATIVE

## 2012-01-05 LAB — GC/CHLAMYDIA PROBE AMP, GENITAL: GC Probe Amp, Genital: NEGATIVE

## 2012-01-05 LAB — HEPATITIS C ANTIBODY: HCV Ab: NEGATIVE

## 2012-01-31 ENCOUNTER — Telehealth: Payer: Self-pay | Admitting: *Deleted

## 2012-01-31 MED ORDER — FLUCONAZOLE 150 MG PO TABS: 150.0000 mg | ORAL_TABLET | Freq: Once | ORAL | Status: DC

## 2012-01-31 MED ORDER — TERCONAZOLE 0.8 % VA CREA: 1.0000 | TOPICAL_CREAM | Freq: Every day | VAGINAL | Status: AC

## 2012-01-31 NOTE — Telephone Encounter (Signed)
Pt calling c/o yeast infection itching, white discharge, irritation. Saw Dr.Fernandez on 01/04/12 giving Flagyl 500 mg twice a day for 5 days. Pt has tried OTC monistat no relief. Pt would like rx. Please advise

## 2012-01-31 NOTE — Telephone Encounter (Signed)
Pt informed with the below note. 

## 2012-01-31 NOTE — Telephone Encounter (Signed)
Diflucan 150 mg x1 dose, office visit if symptoms persist 

## 2012-01-31 NOTE — Telephone Encounter (Signed)
rx sent to pharmacy

## 2012-01-31 NOTE — Telephone Encounter (Signed)
Then I would use Terazol 3 day cream and no Diflucan because the Terazol is stronger than Diflucan and I'm going to cancel the Diflucan order.

## 2012-01-31 NOTE — Telephone Encounter (Signed)
Pt called back and said that she would need cream as well, she said that the diflucan never works without the cream. Please advise

## 2012-03-21 ENCOUNTER — Ambulatory Visit (INDEPENDENT_AMBULATORY_CARE_PROVIDER_SITE_OTHER): Payer: BC Managed Care – PPO | Admitting: Gynecology

## 2012-03-21 ENCOUNTER — Encounter: Payer: Self-pay | Admitting: Gynecology

## 2012-03-21 VITALS — BP 132/80

## 2012-03-21 DIAGNOSIS — N949 Unspecified condition associated with female genital organs and menstrual cycle: Secondary | ICD-10-CM

## 2012-03-21 DIAGNOSIS — R635 Abnormal weight gain: Secondary | ICD-10-CM

## 2012-03-21 DIAGNOSIS — N938 Other specified abnormal uterine and vaginal bleeding: Secondary | ICD-10-CM

## 2012-03-21 NOTE — Progress Notes (Signed)
Patient is a 36 year old with long-standing history of dysfunctional  uterine bleeding who has been followed by my partner Dr. Audie Box who presented to the office today stating that since her last menstrual period has started on August 22  she had been spotting until yesterday. She is sexually active. She states she's had good compliance on the oral contraceptive pill she has been on multiple regimens in the past. Her current oral contraceptive pill Sprintec has worked well over the course of the past year. Patient had attempted in the past of Mirena IUD but could not tolerate it. She has had an ultrasound the past which demonstrated 2 small intramural myomas and normal ovaries and a negative sonohysterogram. She had a normal CBC  Exam: Abdomen: Soft nontender no rebound or guarding Pelvic: Bartholin urethra Skene was within normal limits Vagina: No lesions or discharge no blood in the vaginal vault Cervix: No lesions or discharge Uterus: Anteverted normal size shape and consistency Adnexa: No palpable masses or tenderness Rectal exam not done  Urine pregnancy test: Negative  Assessment/plan: Overweight patient with history of dysfunction uterine bleeding in the past. This last cycle lasted over 2 weeks but has stopped. A CBC will be drawn today. She did not intrapleural contraceptive pills. Patient with negative sonohysterogram November 2012. She will maintain a menstrual calendar if this recurs within the next coming cycle she'll followup with my partner who is her primary physician who had discussed various alternative options in the past see previous notes.

## 2012-03-21 NOTE — Patient Instructions (Addendum)
Continue on your oral contraceptive. Keep a menstrual calender. Since you g=have been doing well on the pill I would not change it. If reoccurs follow up with Dr. Audie Box to readress options.

## 2012-04-08 ENCOUNTER — Telehealth: Payer: Self-pay | Admitting: *Deleted

## 2012-04-08 NOTE — Telephone Encounter (Signed)
Pt left message in voicemail c/o vaginal odor. Left message on voicemail OV best.

## 2012-04-16 ENCOUNTER — Encounter (HOSPITAL_COMMUNITY): Payer: Self-pay | Admitting: *Deleted

## 2012-04-16 ENCOUNTER — Ambulatory Visit (INDEPENDENT_AMBULATORY_CARE_PROVIDER_SITE_OTHER): Payer: BC Managed Care – PPO | Admitting: Gynecology

## 2012-04-16 ENCOUNTER — Emergency Department (HOSPITAL_COMMUNITY)
Admission: EM | Admit: 2012-04-16 | Discharge: 2012-04-16 | Disposition: A | Discharge status: Home / Self Care | Payer: BC Managed Care – PPO | Attending: Emergency Medicine | Admitting: Emergency Medicine

## 2012-04-16 ENCOUNTER — Encounter: Payer: Self-pay | Admitting: Gynecology

## 2012-04-16 DIAGNOSIS — N920 Excessive and frequent menstruation with regular cycle: Secondary | ICD-10-CM

## 2012-04-16 DIAGNOSIS — Z886 Allergy status to analgesic agent status: Secondary | ICD-10-CM | POA: Insufficient documentation

## 2012-04-16 DIAGNOSIS — N926 Irregular menstruation, unspecified: Secondary | ICD-10-CM

## 2012-04-16 DIAGNOSIS — R51 Headache: Secondary | ICD-10-CM | POA: Insufficient documentation

## 2012-04-16 DIAGNOSIS — F411 Generalized anxiety disorder: Secondary | ICD-10-CM | POA: Insufficient documentation

## 2012-04-16 DIAGNOSIS — M542 Cervicalgia: Secondary | ICD-10-CM | POA: Insufficient documentation

## 2012-04-16 DIAGNOSIS — D259 Leiomyoma of uterus, unspecified: Secondary | ICD-10-CM

## 2012-04-16 DIAGNOSIS — Z882 Allergy status to sulfonamides status: Secondary | ICD-10-CM | POA: Insufficient documentation

## 2012-04-16 DIAGNOSIS — D219 Benign neoplasm of connective and other soft tissue, unspecified: Secondary | ICD-10-CM

## 2012-04-16 HISTORY — DX: Anxiety disorder, unspecified: F41.9

## 2012-04-16 LAB — BASIC METABOLIC PANEL
BUN: 8 mg/dL (ref 6–23)
Calcium: 8.7 mg/dL (ref 8.4–10.5)
Chloride: 103 mEq/L (ref 96–112)
Creatinine, Ser: 0.66 mg/dL (ref 0.50–1.10)
GFR calc Af Amer: >90 mL/min (ref >90)
GFR calc non Af Amer: >90 mL/min (ref >90)

## 2012-04-16 LAB — URINALYSIS, ROUTINE W REFLEX MICROSCOPIC
Bilirubin Urine: NEGATIVE
Ketones, ur: NEGATIVE mg/dL
Nitrite: NEGATIVE
Protein, ur: NEGATIVE mg/dL
pH: 5.5 (ref 5.0–8.0)

## 2012-04-16 LAB — CBC WITH DIFFERENTIAL/PLATELET
Basophils Absolute: 0 10*3/uL (ref 0.0–0.1)
Basophils Relative: 0 % (ref 0–1)
Eosinophils Absolute: 0 10*3/uL (ref 0.0–0.7)
Eosinophils Relative: 0 % (ref 0–5)
HCT: 36.2 % (ref 36.0–46.0)
Hemoglobin: 12.1 g/dL (ref 12.0–15.0)
MCH: 28.3 pg (ref 26.0–34.0)
MCHC: 33.4 g/dL (ref 30.0–36.0)
MCV: 84.8 fL (ref 78.0–100.0)
Monocytes Absolute: 0.4 10*3/uL (ref 0.1–1.0)
Monocytes Relative: 6 % (ref 3–12)
RDW: 12.7 % (ref 11.5–15.5)

## 2012-04-16 MED ORDER — IBUPROFEN 800 MG PO TABS: 800.0000 mg | ORAL_TABLET | Freq: Three times a day (TID) | ORAL | Status: DC | PRN

## 2012-04-16 MED ORDER — SODIUM CHLORIDE 0.9 % IV BOLUS (SEPSIS)
1000.0000 mL | Freq: Once | INTRAVENOUS | Status: AC
  Administered 2012-04-16: 1000 mL via INTRAVENOUS

## 2012-04-16 NOTE — ED Notes (Signed)
Pt reports vaginal bleeding x2 months. Has been passing clots. Sts last night she woke up drenched in sweat and c/o occipital HA.

## 2012-04-16 NOTE — ED Provider Notes (Signed)
History     CSN: [id number]  Arrival date & time 04/16/12  0902   First MD Initiated Contact with Patient 04/16/12 [phone]      Chief Complaint  Patient presents with  . Vaginal Bleeding    (Consider location/radiation/quality/duration/timing/severity/associated sxs/prior treatment) The history is provided by the patient.  Marie Wang is a 36 y.o. female here with vaginal bleeding. She noted abnormal bleeding for the last 2 months. She bleeds every day but sometimes she has been passing clots and occasionally it will be heavier than others. Her OB has been on vacation but the covering OB didn't adjust her OCP. Yesterday, she had more bleeding and she woke up covered in sweat. She also had some neck strain and back pain and occasional headaches. No fever or rash. Feels lightheaded but denies passing out or chest pain or shortness of breath.    Past Medical History  Diagnosis Date  . Chlamydia 2009  . Hx gestational diabetes   . Anxiety     Past Surgical History  Procedure Date  . Mouth surgery 1996    Family History  Problem Relation Age of Onset  . Hypertension Mother   . Breast cancer Paternal Aunt   . Ovarian cancer Maternal Grandmother   . Diabetes Paternal Grandmother   . Hypertension Father     borderline    History  Substance Use Topics  . Smoking status: Never Smoker   . Smokeless tobacco: Never Used  . Alcohol Use: Yes     socially    OB History    Grav Para Term Preterm Abortions TAB SAB Ect Mult Living   2 1   1  1   1       Review of Systems  Constitutional: Negative for fever.  Genitourinary: Positive for vaginal bleeding.  Neurological: Positive for headaches.  All other systems reviewed and are negative.    Allergies  Codeine; Sulfa antibiotics; and Prednisone  Home Medications   Current Outpatient Rx  Name Route Sig Dispense Refill  . ESCITALOPRAM OXALATE 20 MG PO TABS Oral Take 20 mg by mouth daily.    Marland Kitchen NORGESTIMATE-ETH  ESTRADIOL 0.25-35 MG-MCG PO TABS Oral Take 1 tablet by mouth daily. 1 Package 11    BP 124/54  Pulse 77  Temp 98.4 F (36.9 C) (Oral)  Resp 16  SpO2 98%  LMP 02/21/2012  Physical Exam  Nursing note and vitals reviewed. Constitutional: She is oriented to person, place, and time. She appears well-developed and well-nourished.       Anxious   HENT:  Head: Normocephalic.  Mouth/Throat: Oropharynx is clear and moist.  Eyes: Conjunctivae normal are normal. Pupils are equal, round, and reactive to light.  Neck: Normal range of motion. Neck supple.       No midline tenderness   Cardiovascular: Normal rate, regular rhythm and normal heart sounds.   Pulmonary/Chest: Effort normal and breath sounds normal. No respiratory distress. She has no wheezes.  Abdominal: Soft. Bowel sounds are normal. She exhibits no distension.       Obese, mild diffuse lower abdominal tenderness, no rebound.   Genitourinary:       No blood in vault. Os nl. Firm and mildly tender and enlarged uterus. No adnexal tenderness.   Musculoskeletal: Normal range of motion. She exhibits no edema and no tenderness.  Neurological: She is alert and oriented to person, place, and time.  Skin: Skin is warm and dry.  Psychiatric: She has a normal mood  and affect. Her behavior is normal. Judgment and thought content normal.    ED Course  Procedures (including critical care time)  Labs Reviewed  BASIC METABOLIC PANEL - Abnormal; Notable for the following:    Sodium 134 (*)     Glucose, Bld 105 (*)     All other components within normal limits  CBC WITH DIFFERENTIAL  URINALYSIS, ROUTINE W REFLEX MICROSCOPIC  PREGNANCY, URINE   No results found.   1. Menorrhagia   2. Fibroids       MDM  Marie Wang is a 35 y.o. female here with vaginal bleeding likely from fibroids. Will get labs, UA, pregnancy test, orthostatics. Will reevaluate.   10:52 AM Patient not orthostatics. Feels better after fluids. UA nl, UCG  neg. Given that there is no active bleeding and H&H stable, will d/c home with outpatient f/u. Patient understands and has appointment in 2 days.        Richardean Canal, MD 04/16/12 1053

## 2012-04-16 NOTE — Progress Notes (Signed)
Patient presents complaining of continued irregular bleeding. Is currently on oral contraceptives with breakthrough bleeding over the last several months requiring a pad every day. Had sonohysterogram a year ago which showed 2 small myomas and a normal-appearing cavity. The biopsy did show evidence of a benign endometrial polyp which was not clinically evident on the sonohysterogram. Was evaluated in the emergency room this morning complaining of fatigue and this continued bleeding. Hemoglobin was 12 with evaluation otherwise negative.  Exam with [name] assistant Abdomen soft nontender without masses guarding rebound organomegaly. Pelvic external BUS vagina normal with slight bleeding. Cervix normal. Uterus grossly normal in size midline mobile nontender. Adnexa without masses or tenderness.  Assessment and plan: Irregular bleeding, small myomas on oral contraceptives. Plan repeat ultrasound now rule out polyp or growth of myomas. Check TSH also. Options for management reviewed to include observation, hormonal manipulation, endometrial ablation, hysterectomy. She had tried a Mirena IUD previously but did not tolerate this and it was removed. At least this point is not interested in further childbearing although discussed obviously with hysterectomy or ablation the absolute need to close this door was reviewed. We'll further discuss after ultrasound. Ibuprofen 800 mg #30 with 3 refills prescribed for cramping.

## 2012-04-16 NOTE — Patient Instructions (Signed)
Follow up for ultrasound as scheduled 

## 2012-04-18 ENCOUNTER — Ambulatory Visit: Payer: BC Managed Care – PPO | Admitting: Gynecology

## 2012-05-05 ENCOUNTER — Ambulatory Visit (INDEPENDENT_AMBULATORY_CARE_PROVIDER_SITE_OTHER): Payer: BC Managed Care – PPO

## 2012-05-05 ENCOUNTER — Encounter: Payer: Self-pay | Admitting: Gynecology

## 2012-05-05 ENCOUNTER — Ambulatory Visit (INDEPENDENT_AMBULATORY_CARE_PROVIDER_SITE_OTHER): Payer: BC Managed Care – PPO | Admitting: Gynecology

## 2012-05-05 ENCOUNTER — Other Ambulatory Visit: Payer: Self-pay | Admitting: Gynecology

## 2012-05-05 DIAGNOSIS — N852 Hypertrophy of uterus: Secondary | ICD-10-CM

## 2012-05-05 DIAGNOSIS — N946 Dysmenorrhea, unspecified: Secondary | ICD-10-CM

## 2012-05-05 DIAGNOSIS — D259 Leiomyoma of uterus, unspecified: Secondary | ICD-10-CM

## 2012-05-05 DIAGNOSIS — N8300 Follicular cyst of ovary, unspecified side: Secondary | ICD-10-CM

## 2012-05-05 DIAGNOSIS — N926 Irregular menstruation, unspecified: Secondary | ICD-10-CM

## 2012-05-05 DIAGNOSIS — D251 Intramural leiomyoma of uterus: Secondary | ICD-10-CM

## 2012-05-05 DIAGNOSIS — N920 Excessive and frequent menstruation with regular cycle: Secondary | ICD-10-CM

## 2012-05-05 NOTE — Progress Notes (Signed)
Patient presents for sonohysterogram due to persistent recurrent irregular bleeding and menorrhagia with passage of clots. Recent blood work showed hemoglobin 12 normal TSH.  Ultrasound shows uterus generous in size. Endometrium 2.4 mm. Several small myomas the largest measuring 27 mm. Right left ovaries normal. Cul-de-sac negative. So histogram performed, sterile technique, easy catheter introduction, difficult to distend but felt adequate to visualize the cavity which was normal/empty. Endometrial sample taken. Patient tolerated well.  Assessment and plan: Irregular menses and lasts 4 months with breakthrough bleeding and clotting.  Ultrasound was several small myomas, cavity normal in appearance. Endometrial sample taken. Patient will follow up for results. Had been on Mirena IUD but stopped due to hormonal-type side effects and cramping. Placed on Loestrin 120 equivalence. Had breakthrough bleeding switched to Sprintec then tri-Sprintec and now on Sprintec equivalent. Breakthrough bleeding with clotting of the last 4 months. I reviewed options to include switching pill again, progesterone only, attempted IUD again, endometrial ablation although recognizing the need for contraception and hysterectomy. At this point patient's thinking of switching pill. May want to consider a slightly higher dose 150 and will rediscuss after biopsy results return. She did have a prior sonohysterogram that was negative with biopsy showing evidence of an endometrial polyp and will reassess now and see what this biopsy shows.

## 2012-05-05 NOTE — Patient Instructions (Signed)
Office will call you with biopsy results 

## 2012-05-07 ENCOUNTER — Telehealth: Payer: Self-pay | Admitting: Gynecology

## 2012-05-07 NOTE — Telephone Encounter (Signed)
Surgery slip sent. Patient will need a preoperative consult

## 2012-05-07 NOTE — Telephone Encounter (Signed)
I spoke with patient earlier today and let her know you recommended D&C, Hysteroscopy.  She has called back saying she is ready to schedule.  Will you fill out your surgery slip for me. Thanks!

## 2012-05-12 ENCOUNTER — Telehealth: Payer: Self-pay | Admitting: Gynecology

## 2012-05-12 NOTE — Telephone Encounter (Signed)
I left message for patient to call me to confirm surgery date.  I had left message on Friday afternoon but never heard back from her. SHe had picked her surgery date.  I stressed to her in this message I left and the earlier one that we need to get her scheduled before Friday for pre-op consultation and to please call asap.

## 2012-05-13 ENCOUNTER — Telehealth: Payer: Self-pay | Admitting: Gynecology

## 2012-05-13 NOTE — Telephone Encounter (Signed)
After leaving message Friday and two yesterday I finally caught up with patient this morning.  She cannot have her surgery on Friday as she is not financially prepared to do it that soon. She wants to wait until first of year and do it as early in Jan as possible.  I have tentatively placed her on Jan 3rd and will call her when I get January schedule.  (Of note, I had not r/s'd  the patient on Dr. Kristie Cowman schedule for that day as I wanted to confirm date with patient first.)

## 2012-05-26 ENCOUNTER — Encounter (HOSPITAL_COMMUNITY): Payer: Self-pay | Admitting: Emergency Medicine

## 2012-05-26 ENCOUNTER — Emergency Department (HOSPITAL_COMMUNITY): Payer: BC Managed Care – PPO

## 2012-05-26 ENCOUNTER — Emergency Department (HOSPITAL_COMMUNITY)
Admission: EM | Admit: 2012-05-26 | Discharge: 2012-05-26 | Disposition: A | Discharge status: Home / Self Care | Payer: BC Managed Care – PPO | Attending: Emergency Medicine | Admitting: Emergency Medicine

## 2012-05-26 DIAGNOSIS — R0789 Other chest pain: Secondary | ICD-10-CM | POA: Insufficient documentation

## 2012-05-26 DIAGNOSIS — Z79899 Other long term (current) drug therapy: Secondary | ICD-10-CM | POA: Insufficient documentation

## 2012-05-26 DIAGNOSIS — R0602 Shortness of breath: Secondary | ICD-10-CM | POA: Insufficient documentation

## 2012-05-26 DIAGNOSIS — R450 Nervousness: Secondary | ICD-10-CM | POA: Insufficient documentation

## 2012-05-26 DIAGNOSIS — R042 Hemoptysis: Secondary | ICD-10-CM | POA: Insufficient documentation

## 2012-05-26 DIAGNOSIS — Z8632 Personal history of gestational diabetes: Secondary | ICD-10-CM | POA: Insufficient documentation

## 2012-05-26 DIAGNOSIS — F411 Generalized anxiety disorder: Secondary | ICD-10-CM | POA: Insufficient documentation

## 2012-05-26 DIAGNOSIS — R110 Nausea: Secondary | ICD-10-CM | POA: Insufficient documentation

## 2012-05-26 DIAGNOSIS — R0989 Other specified symptoms and signs involving the circulatory and respiratory systems: Secondary | ICD-10-CM | POA: Insufficient documentation

## 2012-05-26 DIAGNOSIS — J40 Bronchitis, not specified as acute or chronic: Secondary | ICD-10-CM | POA: Insufficient documentation

## 2012-05-26 DIAGNOSIS — D219 Benign neoplasm of connective and other soft tissue, unspecified: Secondary | ICD-10-CM | POA: Insufficient documentation

## 2012-05-26 DIAGNOSIS — M549 Dorsalgia, unspecified: Secondary | ICD-10-CM | POA: Insufficient documentation

## 2012-05-26 DIAGNOSIS — Z8619 Personal history of other infectious and parasitic diseases: Secondary | ICD-10-CM | POA: Insufficient documentation

## 2012-05-26 MED ORDER — ALBUTEROL SULFATE (5 MG/ML) 0.5% IN NEBU
2.5000 mg | INHALATION_SOLUTION | RESPIRATORY_TRACT | Status: AC
  Administered 2012-05-26: 2.5 mg via RESPIRATORY_TRACT
  Filled 2012-05-26: qty 0.5

## 2012-05-26 MED ORDER — BENZONATATE 100 MG PO CAPS: 100.0000 mg | ORAL_CAPSULE | Freq: Three times a day (TID) | ORAL | Status: DC

## 2012-05-26 MED ORDER — ALBUTEROL SULFATE HFA 108 (90 BASE) MCG/ACT IN AERS
2.0000 | INHALATION_SPRAY | Freq: Four times a day (QID) | RESPIRATORY_TRACT | Status: DC
  Administered 2012-05-26: 2 via RESPIRATORY_TRACT
  Filled 2012-05-26: qty 6.7

## 2012-05-26 NOTE — ED Notes (Addendum)
Pt c/o of a cough that has been going on for about 1 week, tightness in chest that hurts with coughing, nausea but no vomiting. Pt states that she had blood tinged sputum this morning. Denies headache, vomiting, dizziness, night sweats or chills.

## 2012-05-26 NOTE — ED Notes (Signed)
Patient is resting comfortably. 

## 2012-05-26 NOTE — ED Notes (Signed)
Vital signs stable. 

## 2012-05-26 NOTE — ED Provider Notes (Signed)
History     CSN: [id number]  Arrival date & time 05/26/12  0803   First MD Initiated Contact with Patient 05/26/12 [phone]      Chief Complaint  Patient presents with  . Cough  . Nausea  . Shortness of Breath    HPI The patient presents with concerns of ongoing cough, new chest pain, back pain, hemoptysis.  She notes that her symptoms began approximately one week ago with cough, generalized discomfort.  Over the interval week she has gradually developed soreness in her anterior chest, and upper thoracic area posteriorly.  The pain is more pronounced with coughing, though it is present consistently.  She denies exertional or pleuritic aspects.  She denies lightheadedness, syncope, confusion, disorientation. Today, as the patient's symptoms were persistent, and she began to have hematemesis, she became nervous, presents for evaluation. The patient has not had any relief with OTC medication.  No clear exacerbating factors. No sick contacts. No other focal complaints.  Past Medical History  Diagnosis Date  . Chlamydia 2009  . Hx gestational diabetes   . Anxiety   . 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 Mouth surgery 1996    Family History  Problem Relation Age of Onset  . Hypertension Mother   . Breast cancer Paternal Aunt   . Ovarian cancer Maternal Grandmother   . Diabetes Paternal Grandmother   . Hypertension Father     borderline    History  Substance Use Topics  . Smoking status: Never Smoker   . Smokeless tobacco: Never Used  . Alcohol Use: Yes     Comment: socially    OB History    Grav Para Term Preterm Abortions TAB SAB Ect Mult Living   2 1   1  1   1       Review of Systems  Constitutional:       Per HPI, otherwise negative  HENT:       Per HPI, otherwise negative  Eyes: Negative.   Respiratory:       Per HPI, otherwise negative  Cardiovascular:       Per HPI, otherwise negative  Gastrointestinal: Negative for  vomiting.  Genitourinary: Negative.   Musculoskeletal:       Per HPI, otherwise negative  Skin: Negative.   Neurological: Negative for syncope.    Allergies  Codeine; Sulfa antibiotics; and Prednisone  Home Medications   Current Outpatient Rx  Name  Route  Sig  Dispense  Refill  . DM-GUAIFENESIN ER 30-600 MG PO TB12   Oral   Take 1 tablet by mouth every 12 (twelve) hours.         . ESCITALOPRAM OXALATE 20 MG PO TABS   Oral   Take 20 mg by mouth daily.         Marland Kitchen NORGESTIMATE-ETH ESTRADIOL 0.25-35 MG-MCG PO TABS   Oral   Take 1 tablet by mouth daily.   1 Package   11     BP 135/68  Pulse 85  Temp 98.7 F (37.1 C) (Oral)  Resp 18  SpO2 98%  LMP 05/05/2012  Physical Exam  Nursing note and vitals reviewed. Constitutional: She is oriented to person, place, and time. She appears well-developed and well-nourished. No distress.  HENT:  Head: Normocephalic and atraumatic.  Eyes: Conjunctivae normal and EOM are normal.  Cardiovascular: Normal rate and regular rhythm.   Pulmonary/Chest: Effort normal. No accessory muscle usage or stridor.  No respiratory distress. She has wheezes in the right middle field.  Abdominal: She exhibits no distension.  Musculoskeletal: She exhibits no edema.  Neurological: She is alert and oriented to person, place, and time. No cranial nerve deficit.  Skin: Skin is warm and dry.  Psychiatric: She has a normal mood and affect.    ED Course  Procedures (including critical care time)  Labs Reviewed - No data to display No results found.   No diagnosis found.  CXR interpreted by me, d/w the patient.  O2 sat -99%ra, normal  9:56 AM Patient sounds better on re-exam.  We discussed the likely diagnosis, bronchitis, as well as the non-indication for antibiotics.   MDM  This young female presents with concerns of ongoing cough, new hemoptysis on exam she is in no distress.  Initially patient has mild wheezing this resolves with one  albuterol treatment.  Absent fever, distress, significant comorbidities are low suspicion for significant acute illness.  The patient's presentation is most consistent with bronchitis versus URI.  We discussed the non-provision of antibiotics, return precautions, and the patient was discharged in stable condition with an inhaler, antitussives        Gerhard Munch, MD 05/26/12 1001

## 2012-05-26 NOTE — ED Notes (Signed)
Family at bedside. 

## 2012-06-17 ENCOUNTER — Telehealth: Payer: Self-pay | Admitting: Gynecology

## 2012-06-17 NOTE — Telephone Encounter (Signed)
I have left several messages for patient to call me. I had tentatively put her outpt surg on Jan 3 when we had cancelled it prior.  She wanted to r/s to Jan and I kept leaving messages asking her if she wanted to do it Jan 3rd.  She has not called me back so today I left message asking her to call by 5pm today or I would just let Jan 3rd go and she could call me about another date to reschedule to.  I will wait to hear from her and will cancel this date if no word.

## 2012-06-18 ENCOUNTER — Encounter: Payer: Self-pay | Admitting: Gynecology

## 2012-06-18 NOTE — Progress Notes (Signed)
Surgery date cancelled and I will wait for patient to contact me to reschedule.

## 2012-07-04 ENCOUNTER — Encounter (HOSPITAL_BASED_OUTPATIENT_CLINIC_OR_DEPARTMENT_OTHER): Admission: RE | Payer: Self-pay | Source: Ambulatory Visit

## 2012-07-04 ENCOUNTER — Ambulatory Visit (HOSPITAL_BASED_OUTPATIENT_CLINIC_OR_DEPARTMENT_OTHER): Admission: RE | Admit: 2012-07-04 | Payer: BC Managed Care – PPO | Source: Ambulatory Visit | Admitting: Gynecology

## 2012-07-04 SURGERY — DILATATION AND CURETTAGE /HYSTEROSCOPY
Anesthesia: General

## 2012-07-09 ENCOUNTER — Other Ambulatory Visit: Payer: Self-pay | Admitting: Gynecology

## 2012-07-09 NOTE — Progress Notes (Signed)
Letter sent to patient to call regarding scheduling Hysteroscopy, D&C.

## 2012-08-13 ENCOUNTER — Other Ambulatory Visit: Payer: Self-pay | Admitting: *Deleted

## 2012-08-13 MED ORDER — NORGESTIMATE-ETH ESTRADIOL 0.25-35 MG-MCG PO TABS: 1.0000 | ORAL_TABLET | Freq: Every day | ORAL | Status: DC

## 2012-08-29 ENCOUNTER — Encounter: Payer: Self-pay | Admitting: Gynecology

## 2012-08-29 ENCOUNTER — Ambulatory Visit (INDEPENDENT_AMBULATORY_CARE_PROVIDER_SITE_OTHER): Payer: BC Managed Care – PPO | Admitting: Gynecology

## 2012-08-29 DIAGNOSIS — N898 Other specified noninflammatory disorders of vagina: Secondary | ICD-10-CM

## 2012-08-29 DIAGNOSIS — N760 Acute vaginitis: Secondary | ICD-10-CM

## 2012-08-29 DIAGNOSIS — R300 Dysuria: Secondary | ICD-10-CM

## 2012-08-29 DIAGNOSIS — A499 Bacterial infection, unspecified: Secondary | ICD-10-CM

## 2012-08-29 DIAGNOSIS — B9689 Other specified bacterial agents as the cause of diseases classified elsewhere: Secondary | ICD-10-CM

## 2012-08-29 LAB — URINALYSIS W MICROSCOPIC + REFLEX CULTURE
Casts: NONE SEEN
Glucose, UA: NEGATIVE mg/dL
Nitrite: NEGATIVE
pH: 5.5 (ref 5.0–8.0)

## 2012-08-29 MED ORDER — CLINDAMYCIN PHOSPHATE 2 % VA CREA: 1.0000 | TOPICAL_CREAM | Freq: Every day | VAGINAL | Status: DC

## 2012-08-29 NOTE — Progress Notes (Signed)
Patient presents complaining of vaginal discharge and odor following her last period. Does note since I seen her last she's had 2 normal periods. Also over the last week or 2 some mild dysuria but actually over the last several days this seems to have cleared. No fever chills low back pain.  Exam with [name] assistant Abdomen soft nontender without masses guarding rebound organomegaly. Pelvic external BUS vagina with abundant yellow discharge. Cervix normal. Uterus grossly normal midline mobile nontender. Adnexa without masses or tenderness.  Assessment and plan: Bacterial vaginosis by exam and wet prep. Treat with Cleocin vaginal cream nightly x7 days at her choice. Urinalysis questionable UTI although contaminated with many squamous. We'll await culture results and treat accordingly.

## 2012-08-29 NOTE — Addendum Note (Signed)
Addended by: Dayna Barker on: 08/29/2012 11:03 AM   Modules accepted: Orders

## 2012-08-29 NOTE — Patient Instructions (Signed)
Use vaginal cream nightly x7 days. Followup if symptoms persist or recur. Office will call you if it looks like you have a urinary tract infection.

## 2012-09-02 LAB — URINE CULTURE

## 2012-09-16 ENCOUNTER — Ambulatory Visit (INDEPENDENT_AMBULATORY_CARE_PROVIDER_SITE_OTHER): Payer: BC Managed Care – PPO | Admitting: Gynecology

## 2012-09-16 ENCOUNTER — Encounter: Payer: Self-pay | Admitting: Gynecology

## 2012-09-16 DIAGNOSIS — Z113 Encounter for screening for infections with a predominantly sexual mode of transmission: Secondary | ICD-10-CM

## 2012-09-16 DIAGNOSIS — L293 Anogenital pruritus, unspecified: Secondary | ICD-10-CM

## 2012-09-16 DIAGNOSIS — N898 Other specified noninflammatory disorders of vagina: Secondary | ICD-10-CM

## 2012-09-16 DIAGNOSIS — R3915 Urgency of urination: Secondary | ICD-10-CM

## 2012-09-16 DIAGNOSIS — A5901 Trichomonal vulvovaginitis: Secondary | ICD-10-CM

## 2012-09-16 LAB — URINALYSIS W MICROSCOPIC + REFLEX CULTURE
Bilirubin Urine: NEGATIVE
Casts: NONE SEEN
Crystals: NONE SEEN
Ketones, ur: NEGATIVE mg/dL
Nitrite: NEGATIVE
Specific Gravity, Urine: 1.025 (ref 1.005–1.030)
pH: 5.5 (ref 5.0–8.0)

## 2012-09-16 LAB — WET PREP FOR TRICH, YEAST, CLUE: Clue Cells Wet Prep HPF POC: NONE SEEN

## 2012-09-16 MED ORDER — NITROFURANTOIN MONOHYD MACRO 100 MG PO CAPS: 100.0000 mg | ORAL_CAPSULE | Freq: Two times a day (BID) | ORAL | Status: DC

## 2012-09-16 MED ORDER — METRONIDAZOLE 500 MG PO TABS: 500.0000 mg | ORAL_TABLET | Freq: Two times a day (BID) | ORAL | Status: DC

## 2012-09-16 NOTE — Progress Notes (Signed)
Patient presents complaining of urinary frequency and dysuria. Some low back pain nagging in nature. Also with vaginal discharge. And some itching. Was evaluated at an urgent care over the weekend was given ciprofloxacin for 3 days but her symptoms have persisted. Was treated in February for bacterial vaginosis with recent vaginal cream and the symptoms resolved but now seem to have recurred.  Exam with Selena Batten assistant Spine straight with mild right CVA tenderness. Abdomen soft nontender without masses guarding rebound organomegaly. Pelvic external BUS vagina with abundant yellowish discharge. Cervix normal. Uterus grossly normal in size midline mobile nontender. Adnexa without masses or tenderness.  Assessment and plan: 1. UTI. Urinalysis and history consistent with UTI. Will treat with Macrobid 100 twice a day x7 days 2. Trichomonas vaginitis. Wet prep positive for trichomonas. Reviewed possible STD nature. Need for partner evaluation and treatment reviewed. We'll run GC/Chlamydia on her urine. Treat with Flagyl 500 mg twice a day x7 days, alcohol avoidance reviewed. Followup if symptoms persist, worsen or recur.

## 2012-09-16 NOTE — Patient Instructions (Signed)
Take Flagyl antibiotic twice daily for 7 days, avoid alcohol Take Macrodantin antibiotics twice daily for 7 days. Sexual partners need to be evaluated and treated for Trichomonas or they will reinfect you.  Trichomoniasis Trichomoniasis is an infection, caused by the Trichomonas organism, that affects both women and men. In women, the outer female genitalia and the vagina are affected. In men, the penis is mainly affected, but the prostate and other reproductive organs can also be involved. Trichomoniasis is a sexually transmitted disease (STD) and is most often passed to another person through sexual contact. The majority of people who get trichomoniasis do so from a sexual encounter and are also at risk for other STDs. CAUSES   Sexual intercourse with an infected partner.  It can be present in swimming pools or hot tubs. SYMPTOMS   Abnormal gray-green frothy vaginal discharge in women.  Vaginal itching and irritation in women.  Itching and irritation of the area outside the vagina in women.  Penile discharge with or without pain in males.  Inflammation of the urethra (urethritis), causing painful urination.  Bleeding after sexual intercourse. RELATED COMPLICATIONS  Pelvic inflammatory disease.  Infection of the uterus (endometritis).  Infertility.  Tubal (ectopic) pregnancy.  It can be associated with other STDs, including gonorrhea and chlamydia, hepatitis B, and HIV. COMPLICATIONS DURING PREGNANCY  Early (premature) delivery.  Premature rupture of the membranes (PROM).  Low birth weight. DIAGNOSIS   Visualization of Trichomonas under the microscope from the vagina discharge.  Ph of the vagina greater than 4.5, tested with a test tape.  Trich Rapid Test.  Culture of the organism, but this is not usually needed.  It may be found on a Pap test.  Having a "strawberry cervix,"which means the cervix looks very red like a strawberry. TREATMENT   You may be given  medication to fight the infection. Inform your caregiver if you could be or are pregnant. Some medications used to treat the infection should not be taken during pregnancy.  Over-the-counter medications or creams to decrease itching or irritation may be recommended.  Your sexual partner will need to be treated if infected. HOME CARE INSTRUCTIONS   Take all medication prescribed by your caregiver.  Take over-the-counter medication for itching or irritation as directed by your caregiver.  Do not have sexual intercourse while you have the infection.  Do not douche or wear tampons.  Discuss your infection with your partner, as your partner may have acquired the infection from you. Or, your partner may have been the person who transmitted the infection to you.  Have your sex partner examined and treated if necessary.  Practice safe, informed, and protected sex.  See your caregiver for other STD testing. SEEK MEDICAL CARE IF:   You still have symptoms after you finish the medication.  You have an oral temperature above 102 F (38.9 C).  You develop belly (abdominal) pain.  You have pain when you urinate.  You have bleeding after sexual intercourse.  You develop a rash.  The medication makes you sick or makes you throw up (vomit). Document Released: 12/12/2000 Document Revised: 09/10/2011 Document Reviewed: 01/07/2009 [facility] Patient Information 2013 [place], [place].

## 2012-09-17 LAB — GC/CHLAMYDIA PROBE AMP
CT Probe RNA: NEGATIVE
GC Probe RNA: NEGATIVE

## 2012-09-18 ENCOUNTER — Other Ambulatory Visit: Payer: Self-pay | Admitting: Gynecology

## 2012-09-18 DIAGNOSIS — R3129 Other microscopic hematuria: Secondary | ICD-10-CM

## 2012-09-18 LAB — URINE CULTURE

## 2012-09-24 ENCOUNTER — Telehealth: Payer: Self-pay

## 2012-09-24 NOTE — Telephone Encounter (Signed)
Operating request slip sent

## 2012-09-24 NOTE — Telephone Encounter (Signed)
Patient called about "my D&C".  She was scheduled back in Dec but wouldn't return my calls and when I finally caught up with her she cancelled.  At that point she said she wanted to schedule for Jan. I called her numerous times with Jan schedule to no avail.  She now says she is ready to schedule.

## 2012-10-13 ENCOUNTER — Encounter: Payer: Self-pay | Admitting: Gynecology

## 2012-10-13 ENCOUNTER — Ambulatory Visit (INDEPENDENT_AMBULATORY_CARE_PROVIDER_SITE_OTHER): Payer: BC Managed Care – PPO | Admitting: Gynecology

## 2012-10-13 VITALS — BP 124/80 | Ht 64.5 in | Wt 225.0 lb

## 2012-10-13 DIAGNOSIS — Z01419 Encounter for gynecological examination (general) (routine) without abnormal findings: Secondary | ICD-10-CM

## 2012-10-13 DIAGNOSIS — Z113 Encounter for screening for infections with a predominantly sexual mode of transmission: Secondary | ICD-10-CM

## 2012-10-13 DIAGNOSIS — Z13220 Encounter for screening for lipoid disorders: Secondary | ICD-10-CM

## 2012-10-13 LAB — CBC WITH DIFFERENTIAL/PLATELET
Basophils Absolute: 0 10*3/uL (ref 0.0–0.1)
Basophils Relative: 0 % (ref 0–1)
Eosinophils Absolute: 0.1 10*3/uL (ref 0.0–0.7)
Hemoglobin: 11.8 g/dL — ABNORMAL LOW (ref 12.0–15.0)
MCH: 28.2 pg (ref 26.0–34.0)
MCHC: 32.9 g/dL (ref 30.0–36.0)
Monocytes Relative: 10 % (ref 3–12)
Neutro Abs: 3.6 10*3/uL (ref 1.7–7.7)
Neutrophils Relative %: 40 % — ABNORMAL LOW (ref 43–77)
Platelets: 297 10*3/uL (ref 150–400)
RDW: 14.2 % (ref 11.5–15.5)

## 2012-10-13 LAB — COMPREHENSIVE METABOLIC PANEL
AST: 12 U/L (ref 0–37)
Albumin: 3.7 g/dL (ref 3.5–5.2)
Alkaline Phosphatase: 59 U/L (ref 39–117)
Glucose, Bld: 68 mg/dL — ABNORMAL LOW (ref 70–99)
Potassium: 4.1 mEq/L (ref 3.5–5.3)
Sodium: 136 mEq/L (ref 135–145)
Total Bilirubin: 0.3 mg/dL (ref 0.3–1.2)
Total Protein: 7.4 g/dL (ref 6.0–8.3)

## 2012-10-13 LAB — LIPID PANEL
LDL Cholesterol: 89 mg/dL (ref 0–99)
Triglycerides: 149 mg/dL (ref <150)
VLDL: 30 mg/dL (ref 0–40)

## 2012-10-13 MED ORDER — CLINDAMYCIN PHOSPHATE 2 % VA CREA: 1.0000 | TOPICAL_CREAM | Freq: Every day | VAGINAL | Status: DC

## 2012-10-13 MED ORDER — NORGESTIMATE-ETH ESTRADIOL 0.25-35 MG-MCG PO TABS: 1.0000 | ORAL_TABLET | Freq: Every day | ORAL | Status: DC

## 2012-10-13 NOTE — Patient Instructions (Signed)
Use 1 applicator of the vaginal cream after each menses to see if this doesn't prevent the vaginal odor. Follow up in 1 year for annual exam, sooner as needed.

## 2012-10-13 NOTE — Progress Notes (Signed)
Marie Wang May 19, 1976 [id number]        37 y.o.  G2P1011 for annual exam.  Several issues noted below.  Past medical history,surgical history, medications, allergies, family history and social history were all reviewed and documented in the EPIC chart. ROS:  Was performed and pertinent positives and negatives are included in the history.  Exam: [name] assistant Filed Vitals:   10/13/12 1528  BP: 124/80  Height: 5' 4.5" (1.638 m)  Weight: 225 lb (102.059 kg)   General appearance  Normal Skin grossly normal Head/Neck normal with no cervical or supraclavicular adenopathy thyroid normal Lungs  clear Cardiac RR, without RMG Abdominal  soft, nontender, without masses, organomegaly or hernia Breasts  examined lying and sitting without masses, retractions, discharge or axillary adenopathy. Pelvic  Ext/BUS/vagina  normal   Cervix  normal   Uterus  anteverted, normal size, shape and contour, midline and mobile nontender   Adnexa  Without masses or tenderness    Anus and perineum  normal   Rectovaginal  normal sphincter tone without palpated masses or tenderness.    Assessment/Plan:  37 y.o. G44P1011 female for annual exam.   1. Recent treatment for trichomonas. The patient took 7 days worth of Flagyl and her symptoms have all resolved. Exam today is normal. No longer sexually active. 2. STD screening. Had recent GC Chlamydia screen negative. She asked for serum screening and HIV RPR hepatitis B and hepatitis C done.  No known exposure but wants to be screened. 3. BCPs. Doing well on Sprintec equivalent. I refilled her x1 year.  Otherwise healthy/nonsmoker. Risks of stroke heart attack DVT reviewed. 4. Vaginal odor several days after each menses then resolves spontaneously. Certainly sounds like bacterial vaginosis transiently in response to her menses. Options for management reviewed and ultimately we'll plan on Cleocin Vaginal cream 7 day, she'll use one applicator at the end of her  menses monthly as a suppressive therapy to see if this does not eliminate her transient odor. 5. Mammography. I reviewed screening mammographic recommendations between 35 and 40. Patient prefers to wait closer to 40. SBE monthly reviewed. 6. Pap smear 2012. No Pap smear done today.  No history of significant abnormal Pap smears. Plan repeat next year at 3 year interval. 7. Health maintenance. Patient requests general health maintenance blood work. CBC comprehensive metabolic panel lipid profile urinalysis ordered along with her STD blood work. Follow up one year, sooner as needed.    Dara Lords MD, 3:54 PM 10/13/2012

## 2012-10-13 NOTE — Addendum Note (Signed)
Addended by: Dara Lords on: 10/13/2012 04:26 PM   Modules accepted: Orders

## 2012-10-14 LAB — URINALYSIS W MICROSCOPIC + REFLEX CULTURE
Bacteria, UA: NONE SEEN
Crystals: NONE SEEN
Hgb urine dipstick: NEGATIVE
Ketones, ur: NEGATIVE mg/dL
Leukocytes, UA: NEGATIVE
Nitrite: NEGATIVE
Specific Gravity, Urine: 1.019 (ref 1.005–1.030)
Urobilinogen, UA: 0.2 mg/dL (ref 0.0–1.0)
pH: 5.5 (ref 5.0–8.0)

## 2012-10-14 LAB — HIV ANTIBODY (ROUTINE TESTING W REFLEX): HIV: NONREACTIVE

## 2012-10-14 LAB — HEPATITIS C ANTIBODY: HCV Ab: NEGATIVE

## 2012-10-14 LAB — RPR

## 2012-10-14 NOTE — Telephone Encounter (Signed)
3:50pm I called patient. Left message to call me.  I explained that I will be out of the office until noon on Monday 09/30/12.  I did give her insurance deductible amount and her [employer] pre-payment amount ($536) and told her that we can discuss this in detail when we talk.

## 2012-10-15 ENCOUNTER — Ambulatory Visit
Admission: RE | Admit: 2012-10-15 | Discharge: 2012-10-15 | Disposition: A | Discharge status: Home / Self Care | Payer: BC Managed Care – PPO | Source: Ambulatory Visit | Attending: Family Medicine | Admitting: Family Medicine

## 2012-10-15 ENCOUNTER — Other Ambulatory Visit: Payer: Self-pay | Admitting: Family Medicine

## 2012-10-15 DIAGNOSIS — G8929 Other chronic pain: Secondary | ICD-10-CM

## 2012-10-15 DIAGNOSIS — R200 Anesthesia of skin: Secondary | ICD-10-CM

## 2012-10-15 DIAGNOSIS — M546 Pain in thoracic spine: Secondary | ICD-10-CM

## 2012-10-20 ENCOUNTER — Ambulatory Visit (INDEPENDENT_AMBULATORY_CARE_PROVIDER_SITE_OTHER): Payer: BC Managed Care – PPO | Admitting: Gynecology

## 2012-10-20 ENCOUNTER — Encounter: Payer: Self-pay | Admitting: Gynecology

## 2012-10-20 ENCOUNTER — Other Ambulatory Visit: Payer: Self-pay | Admitting: Gynecology

## 2012-10-20 ENCOUNTER — Encounter: Payer: Self-pay | Admitting: *Deleted

## 2012-10-20 DIAGNOSIS — N760 Acute vaginitis: Secondary | ICD-10-CM

## 2012-10-20 DIAGNOSIS — R3129 Other microscopic hematuria: Secondary | ICD-10-CM

## 2012-10-20 DIAGNOSIS — A499 Bacterial infection, unspecified: Secondary | ICD-10-CM

## 2012-10-20 DIAGNOSIS — M549 Dorsalgia, unspecified: Secondary | ICD-10-CM

## 2012-10-20 DIAGNOSIS — B9689 Other specified bacterial agents as the cause of diseases classified elsewhere: Secondary | ICD-10-CM

## 2012-10-20 DIAGNOSIS — N898 Other specified noninflammatory disorders of vagina: Secondary | ICD-10-CM

## 2012-10-20 LAB — URINALYSIS W MICROSCOPIC + REFLEX CULTURE
Bilirubin Urine: NEGATIVE
Casts: NONE SEEN
Crystals: NONE SEEN
Ketones, ur: NEGATIVE mg/dL
Specific Gravity, Urine: >1.03 — ABNORMAL HIGH (ref 1.005–1.030)
WBC, UA: NONE SEEN WBC/hpf (ref <3)
pH: 5.5 (ref 5.0–8.0)

## 2012-10-20 LAB — WET PREP FOR TRICH, YEAST, CLUE: Yeast Wet Prep HPF POC: NONE SEEN

## 2012-10-20 MED ORDER — CLINDAMYCIN PHOSPHATE 2 % VA CREA: 1.0000 | TOPICAL_CREAM | Freq: Every day | VAGINAL | Status: DC

## 2012-10-20 NOTE — Patient Instructions (Signed)
Use Cleocin vaginal cream nightly x7 days. Followup if symptoms persist, worsen or recur.

## 2012-10-20 NOTE — Progress Notes (Signed)
Patient presents with several day history of vaginal discharge and odor. Had recently been treated for Trichomonas. Has not been sexually active in the interim. Did have a negative GC and Chlamydia screen at that time. Also noted some mild pelvic discomfort. No dysuria frequency urgency. No urinary symptoms.  Exam with Marie Wang assistant Spine straight without CVA tenderness Abdomen soft nontender without masses guarding rebound organomegaly External BUS vagina with white frothy discharge. Cervix normal. Uterus normal size midline mobile nontender. Adnexa without masses or tenderness.  Assessment and plan: History, exam and wet prep consistent with bacterial vaginosis. Options for treatment reviewed and she elects for Cleocin vaginal cream nightly x7 days. Seems to occur frequently after her menses. Recommended trial of medicated douche at the tail end of her menses. If recurs will discuss other suppressive regimens. Urinalysis shows few bacteria with 0-2 RBCs. Followup culture and treat accordingly.

## 2012-10-22 LAB — URINE CULTURE
Colony Count: NO GROWTH
Organism ID, Bacteria: NO GROWTH

## 2012-11-12 ENCOUNTER — Ambulatory Visit (INDEPENDENT_AMBULATORY_CARE_PROVIDER_SITE_OTHER): Payer: BC Managed Care – PPO | Admitting: Gynecology

## 2012-11-12 ENCOUNTER — Encounter: Payer: Self-pay | Admitting: Gynecology

## 2012-11-12 DIAGNOSIS — N898 Other specified noninflammatory disorders of vagina: Secondary | ICD-10-CM

## 2012-11-12 DIAGNOSIS — B373 Candidiasis of vulva and vagina: Secondary | ICD-10-CM

## 2012-11-12 DIAGNOSIS — L293 Anogenital pruritus, unspecified: Secondary | ICD-10-CM

## 2012-11-12 MED ORDER — TERCONAZOLE 0.8 % VA CREA: 1.0000 | TOPICAL_CREAM | Freq: Every day | VAGINAL | Status: DC

## 2012-11-12 NOTE — Patient Instructions (Signed)
Use Terazol cream nightly for 3 days followup if symptoms persist, worsen or recur.

## 2012-11-12 NOTE — Progress Notes (Signed)
Patient presents having been seen 10/20/2012 and being treated for bacterial vaginosis. Her symptoms resolved. Of the last 2 days she had the onset of intense vulvar pruritus with discharge.  Exam with Selena Batten assistant External BUS vagina with white clumpy discharge. Cervix normal. Uterus normal size midline mobile nontender. Adnexa without masses or tenderness.   Assessment and plan: Symptoms exam and wet prep consistent with yeast vulvovaginitis. Treat with Terazol 3 cream. Followup if symptoms persist worsen or recur.

## 2012-11-14 ENCOUNTER — Encounter: Payer: Self-pay | Admitting: Gynecology

## 2012-11-17 ENCOUNTER — Telehealth: Payer: Self-pay | Admitting: *Deleted

## 2012-11-17 MED ORDER — FLUCONAZOLE 150 MG PO TABS: 150.0000 mg | ORAL_TABLET | Freq: Once | ORAL | Status: DC

## 2012-11-17 NOTE — Telephone Encounter (Signed)
Please call and call in Diflucan 150 times one dose with 1 refill.

## 2012-11-17 NOTE — Telephone Encounter (Signed)
Pt informed with the below note, rx sent. 

## 2012-11-17 NOTE — Telephone Encounter (Signed)
(  TF patient) was treated for yeast infection on 11/12/12 given Terazol 3 day cream, she took rx and itching still there. Pt asked if she could have more medication for this infection. Please advise

## 2012-11-29 ENCOUNTER — Other Ambulatory Visit: Payer: Self-pay | Admitting: Gynecology

## 2012-12-01 ENCOUNTER — Other Ambulatory Visit: Payer: Self-pay

## 2012-12-01 MED ORDER — NORGESTIMATE-ETH ESTRADIOL 0.25-35 MG-MCG PO TABS: 1.0000 | ORAL_TABLET | Freq: Every day | ORAL | Status: DC

## 2012-12-23 ENCOUNTER — Telehealth: Payer: Self-pay | Admitting: *Deleted

## 2012-12-23 NOTE — Telephone Encounter (Signed)
Recommend office visit for evaluation. Patient has had Trichomonas, bacterial vaginosis and yeast all within the past year it really is unclear what would be the etiology for her recurrence. She will need to be examined.

## 2012-12-23 NOTE — Telephone Encounter (Signed)
Pt calling c/o recurrent yeast infection after cycle, pt is requesting medication stating this happens every month with the recurrent. Please advise

## 2012-12-24 NOTE — Telephone Encounter (Signed)
Pt informed with the below note, will make OV.

## 2012-12-25 ENCOUNTER — Encounter: Payer: Self-pay | Admitting: Gynecology

## 2012-12-25 ENCOUNTER — Ambulatory Visit (INDEPENDENT_AMBULATORY_CARE_PROVIDER_SITE_OTHER): Payer: BC Managed Care – PPO | Admitting: Gynecology

## 2012-12-25 DIAGNOSIS — L293 Anogenital pruritus, unspecified: Secondary | ICD-10-CM

## 2012-12-25 DIAGNOSIS — N898 Other specified noninflammatory disorders of vagina: Secondary | ICD-10-CM

## 2012-12-25 LAB — WET PREP FOR TRICH, YEAST, CLUE: Clue Cells Wet Prep HPF POC: NONE SEEN

## 2012-12-25 MED ORDER — FLUCONAZOLE 150 MG PO TABS: 150.0000 mg | ORAL_TABLET | Freq: Once | ORAL | Status: DC

## 2012-12-25 NOTE — Progress Notes (Signed)
The patient presents complaining of vaginal itching. Seems to be recurrent issue a day or so after her menses each month. She had been treated for bacterial vaginosis in the past as well as. She's not having any discharge or odor but primarily itching.  Exam with Berenice Bouton External BUS vagina with scant discharge. Cervix normal. Uterus normal size midline mobile nontender. Adnexa without masses or tenderness.  Assessment and plan: Wet prep is negative. Suspect low level yeast following her menses. Will cover with Diflucan 150 mg tablet times one. I did give her a prescription for 4 tablets with one refill. I recommended that she take one tablet prophylactically at the end of her menses over the next 7 months. Assuming this keeps her symptoms away. If she does have recurrent symptoms then she'll represent for evaluation.

## 2012-12-25 NOTE — Patient Instructions (Signed)
Take one Diflucan tablet now. Take one Diflucan tablet at the end of each menses to see if this doesn't prevent you from having symptoms.

## 2012-12-30 ENCOUNTER — Encounter: Payer: Self-pay | Admitting: Gynecology

## 2013-01-27 ENCOUNTER — Encounter: Payer: Self-pay | Admitting: Gynecology

## 2013-01-27 ENCOUNTER — Ambulatory Visit (INDEPENDENT_AMBULATORY_CARE_PROVIDER_SITE_OTHER): Payer: BC Managed Care – PPO | Admitting: Gynecology

## 2013-01-27 DIAGNOSIS — K602 Anal fissure, unspecified: Secondary | ICD-10-CM

## 2013-01-27 DIAGNOSIS — N926 Irregular menstruation, unspecified: Secondary | ICD-10-CM

## 2013-01-27 MED ORDER — NORGESTIMATE-ETH ESTRADIOL 0.25-35 MG-MCG PO TABS: 1.0000 | ORAL_TABLET | Freq: Every day | ORAL | Status: DC

## 2013-01-27 NOTE — Progress Notes (Signed)
Patient presents with history of irregular bleeding this past fall. Sonohysterogram showed several small myomas but a normal cavity. Endometrial biopsy showed "endometrial polyps". She was scheduled for a hysteroscopy D&C but canceled it. Her irregular bleeding resolved on birth control pills and she resumed regular monthly menses. She now started a period 2 weeks ago but has bled on and off since then. Also notes rectal discomfort over the last week or so.  Exam with Selena Batten assistant External BUS vagina with scant bleeding. Cervix normal. Uterus grossly normal in size midline mobile nontender. Adnexa without masses or tenderness. Rectal exam shows a small fissure at 12:00.  Assessment and plan: 1. Irregular bleeding. Recommend stop pills now and restart a new pack this coming Sunday. If irregular bleeding continues then she knows that we need to proceed with a hysteroscopy D&C to clear the endometrial cavity. 2. Anal fissure. Recommended OTC Preparation H or Anusol. Call if continues.

## 2013-01-27 NOTE — Patient Instructions (Signed)
Stop birth control pills now. Start a new pack of pills this coming Sunday. If irregular bleeding continues then we'll need to arrange a D&C.

## 2013-03-18 ENCOUNTER — Encounter (HOSPITAL_COMMUNITY): Payer: Self-pay | Admitting: *Deleted

## 2013-03-18 ENCOUNTER — Emergency Department (HOSPITAL_COMMUNITY): Payer: BC Managed Care – PPO

## 2013-03-18 ENCOUNTER — Emergency Department (HOSPITAL_COMMUNITY)
Admission: EM | Admit: 2013-03-18 | Discharge: 2013-03-18 | Disposition: A | Discharge status: Home / Self Care | Payer: BC Managed Care – PPO | Attending: Emergency Medicine | Admitting: Emergency Medicine

## 2013-03-18 DIAGNOSIS — R209 Unspecified disturbances of skin sensation: Secondary | ICD-10-CM | POA: Insufficient documentation

## 2013-03-18 DIAGNOSIS — Z872 Personal history of diseases of the skin and subcutaneous tissue: Secondary | ICD-10-CM | POA: Insufficient documentation

## 2013-03-18 DIAGNOSIS — R002 Palpitations: Secondary | ICD-10-CM | POA: Insufficient documentation

## 2013-03-18 DIAGNOSIS — E669 Obesity, unspecified: Secondary | ICD-10-CM | POA: Insufficient documentation

## 2013-03-18 DIAGNOSIS — F411 Generalized anxiety disorder: Secondary | ICD-10-CM | POA: Insufficient documentation

## 2013-03-18 DIAGNOSIS — Z79899 Other long term (current) drug therapy: Secondary | ICD-10-CM | POA: Insufficient documentation

## 2013-03-18 DIAGNOSIS — R1032 Left lower quadrant pain: Secondary | ICD-10-CM | POA: Insufficient documentation

## 2013-03-18 DIAGNOSIS — R42 Dizziness and giddiness: Secondary | ICD-10-CM | POA: Insufficient documentation

## 2013-03-18 DIAGNOSIS — Z3202 Encounter for pregnancy test, result negative: Secondary | ICD-10-CM | POA: Insufficient documentation

## 2013-03-18 DIAGNOSIS — R1012 Left upper quadrant pain: Secondary | ICD-10-CM | POA: Insufficient documentation

## 2013-03-18 DIAGNOSIS — Z8619 Personal history of other infectious and parasitic diseases: Secondary | ICD-10-CM | POA: Insufficient documentation

## 2013-03-18 DIAGNOSIS — Z8632 Personal history of gestational diabetes: Secondary | ICD-10-CM | POA: Insufficient documentation

## 2013-03-18 DIAGNOSIS — K921 Melena: Secondary | ICD-10-CM | POA: Insufficient documentation

## 2013-03-18 DIAGNOSIS — R109 Unspecified abdominal pain: Secondary | ICD-10-CM

## 2013-03-18 DIAGNOSIS — R1013 Epigastric pain: Secondary | ICD-10-CM | POA: Insufficient documentation

## 2013-03-18 DIAGNOSIS — R110 Nausea: Secondary | ICD-10-CM | POA: Insufficient documentation

## 2013-03-18 DIAGNOSIS — K625 Hemorrhage of anus and rectum: Secondary | ICD-10-CM | POA: Insufficient documentation

## 2013-03-18 LAB — CBC WITH DIFFERENTIAL/PLATELET
Eosinophils Relative: 1 % (ref 0–5)
HCT: 36.6 % (ref 36.0–46.0)
Hemoglobin: 12.1 g/dL (ref 12.0–15.0)
Lymphocytes Relative: 40 % (ref 12–46)
Lymphs Abs: 2.8 10*3/uL (ref 0.7–4.0)
MCV: 86.3 fL (ref 78.0–100.0)
Monocytes Absolute: 0.7 10*3/uL (ref 0.1–1.0)
Monocytes Relative: 10 % (ref 3–12)
Neutro Abs: 3.3 10*3/uL (ref 1.7–7.7)
RBC: 4.24 MIL/uL (ref 3.87–5.11)
RDW: 12.8 % (ref 11.5–15.5)
WBC: 7 10*3/uL (ref 4.0–10.5)

## 2013-03-18 LAB — URINE MICROSCOPIC-ADD ON

## 2013-03-18 LAB — COMPREHENSIVE METABOLIC PANEL
BUN: 10 mg/dL (ref 6–23)
CO2: 22 mEq/L (ref 19–32)
Calcium: 9.1 mg/dL (ref 8.4–10.5)
Chloride: 103 mEq/L (ref 96–112)
Creatinine, Ser: 0.76 mg/dL (ref 0.50–1.10)
GFR calc Af Amer: >90 mL/min (ref >90)
GFR calc non Af Amer: >90 mL/min (ref >90)
Glucose, Bld: 122 mg/dL — ABNORMAL HIGH (ref 70–99)
Total Bilirubin: 0.3 mg/dL (ref 0.3–1.2)

## 2013-03-18 LAB — URINALYSIS, ROUTINE W REFLEX MICROSCOPIC
Bilirubin Urine: NEGATIVE
Ketones, ur: NEGATIVE mg/dL
Nitrite: NEGATIVE
Protein, ur: NEGATIVE mg/dL
pH: 5.5 (ref 5.0–8.0)

## 2013-03-18 LAB — PROTIME-INR: INR: 0.99 (ref 0.00–1.49)

## 2013-03-18 MED ORDER — IOHEXOL 300 MG/ML  SOLN
100.0000 mL | Freq: Once | INTRAMUSCULAR | Status: AC | PRN
  Administered 2013-03-18: 100 mL via INTRAVENOUS

## 2013-03-18 MED ORDER — IOHEXOL 300 MG/ML  SOLN
50.0000 mL | Freq: Once | INTRAMUSCULAR | Status: AC | PRN
Start: 2013-03-18 — End: 2013-03-18
  Administered 2013-03-18: 50 mL via ORAL

## 2013-03-18 MED ORDER — PROMETHAZINE HCL 25 MG PO TABS: 25.0000 mg | ORAL_TABLET | Freq: Four times a day (QID) | ORAL | Status: DC | PRN

## 2013-03-18 NOTE — ED Notes (Signed)
Pt states that she was awakened from sleep with heart palpitations; pt states "I feel like my heart is going to burst"; pt also c/o rectal bleeding; states when she went to the BR this evening there was bright red blood noted to toilet; pt states that she has vomited every night for the last 2 nights and has had abd pain; pt states she has a hx of anxiety and that this is not anxiety

## 2013-03-18 NOTE — ED Notes (Signed)
Patient transported to CT 

## 2013-03-18 NOTE — ED Provider Notes (Signed)
CSN: [id number]     Arrival date & time 03/18/13  0435 History   First MD Initiated Contact with Patient 03/18/13 0448     Chief Complaint  Patient presents with  . Palpitations  . Numbness  . Rectal Bleeding   (Consider location/radiation/quality/duration/timing/severity/associated sxs/prior Treatment) HPI Comments: Patient states she was awakened.  This evening by numbness in her left leg.  She thought she was sleeping in the wrong position.  She change positions.  Needed to round, and it did not change.  The sensation.  She then developed abdominal discomfort.  Went to use the bathroom, and had a dark bowel movement.  This was repeated shortly thereafter, with a second bloody bowel movement, and increase in abdominal pain.  She also states, that for the last 2, nights.  She's had one episode of nausea.  That has awakened her from sleep, which is very unusual for her.  She has recently started taking birth control pills, and is in mid cycle, with no reported, vaginal discharge, or pelvic pain.  She denies any dysuria, denies ill contacts.  Denies history of diverticular disease.  Does, state she has a history of stress-induced  Palpitations and at this time.  Feels, like her heart is beating fast  Patient is a 37 y.o. female presenting with palpitations and hematochezia. The history is provided by the patient.  Palpitations Palpitations quality:  Regular Onset quality:  Sudden Progression:  Waxing and waning Chronicity:  Recurrent Relieved by:  None tried Worsened by:  Stress Ineffective treatments:  None tried Associated symptoms: dizziness and nausea   Associated symptoms: no chest pain, no shortness of breath and no vomiting   Rectal Bleeding Associated symptoms: abdominal pain and dizziness   Associated symptoms: no fever and no vomiting     Past Medical History  Diagnosis Date  . Chlamydia 2009  . Hx gestational diabetes   . Anxiety   . Leiomyoma 2013    small   Past  Surgical History  Procedure Laterality Date  . Mouth surgery  1996   Family History  Problem Relation Age of Onset  . Hypertension Mother   . Breast cancer Paternal Aunt 38  . Ovarian cancer Maternal Grandmother   . Diabetes Paternal Grandmother   . Hypertension Father     borderline  . Diabetes Father    History  Substance Use Topics  . Smoking status: Never Smoker   . Smokeless tobacco: Never Used  . Alcohol Use: Yes     Comment: socially   OB History   Grav Para Term Preterm Abortions TAB SAB Ect Mult Living   2 1 1  1  1   1      Review of Systems  Constitutional: Negative for fever and chills.  Respiratory: Negative for shortness of breath.   Cardiovascular: Positive for palpitations. Negative for chest pain.  Gastrointestinal: Positive for nausea, abdominal pain, blood in stool and hematochezia. Negative for vomiting, diarrhea, constipation and rectal pain.  Genitourinary: Negative for dysuria, decreased urine volume, vaginal bleeding, vaginal discharge and vaginal pain.  Musculoskeletal: Negative for myalgias.  Neurological: Positive for dizziness. Negative for weakness.  All other systems reviewed and are negative.    Allergies  Codeine; Sulfa antibiotics; Ciprofloxacin; and Prednisone  Home Medications   Current Outpatient Rx  Name  Route  Sig  Dispense  Refill  . ALPRAZolam (XANAX) 0.25 MG tablet   Oral   Take 0.25 mg by mouth at bedtime as needed for sleep  or anxiety.         Marland Kitchen escitalopram (LEXAPRO) 20 MG tablet   Oral   Take 20 mg by mouth daily.         . norgestimate-ethinyl estradiol (ORTHO-CYCLEN,SPRINTEC,PREVIFEM) 0.25-35 MG-MCG tablet   Oral   Take 1 tablet by mouth daily.   1 Package   0     Patient will need additional pack of birth control ...   . promethazine (PHENERGAN) 25 MG tablet   Oral   Take 1 tablet (25 mg total) by mouth every 6 (six) hours as needed for nausea.   12 tablet   0    BP 115/67  Pulse 76  Temp(Src) 98  F (36.7 C) (Oral)  Resp 20  SpO2 100%  LMP 03/17/2013 Physical Exam  Vitals reviewed. Constitutional: She is oriented to person, place, and time. She appears well-developed and well-nourished. She appears distressed.  Obese  Eyes: Pupils are equal, round, and reactive to light.  Neck: Normal range of motion.  Cardiovascular: Normal rate and regular rhythm.  Exam reveals no gallop.   No murmur heard. Pulmonary/Chest: Effort normal and breath sounds normal. No respiratory distress. She has no wheezes.  Abdominal: Soft. Bowel sounds are normal. She exhibits no distension and no mass. There is no hepatosplenomegaly. There is tenderness in the epigastric area, left upper quadrant and left lower quadrant.  Genitourinary: Guaiac positive stool.  Stool is guaiac positive.  It is light brown in color  Musculoskeletal: Normal range of motion. She exhibits no edema and no tenderness.  Neurological: She is alert and oriented to person, place, and time.  Skin: Skin is warm. No rash noted. No erythema.    ED Course  Procedures (including critical care time) Labs Review Labs Reviewed  COMPREHENSIVE METABOLIC PANEL - Abnormal; Notable for the following:    Glucose, Bld 122 (*)    Albumin 3.1 (*)    All other components within normal limits  URINALYSIS, ROUTINE W REFLEX MICROSCOPIC - Abnormal; Notable for the following:    APPearance CLOUDY (*)    Hgb urine dipstick LARGE (*)    Leukocytes, UA MODERATE (*)    All other components within normal limits  URINE MICROSCOPIC-ADD ON - Abnormal; Notable for the following:    Bacteria, UA FEW (*)    All other components within normal limits  OCCULT BLOOD, POC DEVICE - Abnormal; Notable for the following:    Fecal Occult Bld POSITIVE (*)    All other components within normal limits  URINE CULTURE  CBC WITH DIFFERENTIAL  PROTIME-INR  POCT PREGNANCY, URINE   Imaging Review No results found.  Date: 03/18/2013  Rate: 91  Rhythm: normal sinus  rhythm  QRS Axis: normal  Intervals: normal  ST/T Wave abnormalities: nonspecific T wave changes  Conduction Disutrbances:none  Narrative Interpretation: borderline   Old EKG Reviewed: unchanged  MDM   1. Rectal bleeding   2. Abdominal cramping         Arman Filter, NP 03/20/13 2015

## 2013-03-18 NOTE — ED Provider Notes (Signed)
6:11 AM Patient signed out to me by Marie Favor, NP, at change of shift.  Patient with abdominal cramping and rectal bleeding, transient lightheadedness.  Also with complaints of transient leg numbness.  Pending labs.  Anticipate need for CT abd/pelvis to r/o diverticulitis.    6:27 AM Patient seen and examined by me.  Discussed lab results.  Pt notes her bilateral legs are "tight" - has been walking a lot on concrete, more than usual activity.  No swelling in her legs.  No erythema.  Distal pulses intact.  Denies numbness or tingling.  Abdomen is soft, nondistended, TTP suprapubic, RLQ, no guarding, no rebound.  Plan is for CT abd/pelvis, urinalysis, urine pregnancy test.  Pt declines pain and nausea medication at this time.    8:31 AM Ct is negative. Pt denies any abnormal vaginal discharge or vaginal bleeding out of the ordinary for her.  Pt to be d/c home with nausea medication. Discussed all results with patient (including liver lesion, unchanged since recent US). Recommended close PCP follow up. Pt given return precautions.  Pt verbalizes understanding and agrees with plan.     Results for orders placed during the hospital encounter of 03/18/13  CBC WITH DIFFERENTIAL      Result Value Range   WBC 7.0  4.0 - 10.5 K/uL   RBC 4.24  3.87 - 5.11 MIL/uL   Hemoglobin 12.1  12.0 - 15.0 g/dL   HCT 47.8  29.5 - 62.1 %   MCV 86.3  78.0 - 100.0 fL   MCH 28.5  26.0 - 34.0 pg   MCHC 33.1  30.0 - 36.0 g/dL   RDW 30.8  65.7 - 84.6 %   Platelets 243  150 - 400 K/uL   Neutrophils Relative % 48  43 - 77 %   Neutro Abs 3.3  1.7 - 7.7 K/uL   Lymphocytes Relative 40  12 - 46 %   Lymphs Abs 2.8  0.7 - 4.0 K/uL   Monocytes Relative 10  3 - 12 %   Monocytes Absolute 0.7  0.1 - 1.0 K/uL   Eosinophils Relative 1  0 - 5 %   Eosinophils Absolute 0.1  0.0 - 0.7 K/uL   Basophils Relative 1  0 - 1 %   Basophils Absolute 0.0  0.0 - 0.1 K/uL  COMPREHENSIVE METABOLIC PANEL      Result Value Range   Sodium 136  135  - 145 mEq/L   Potassium 3.7  3.5 - 5.1 mEq/L   Chloride 103  96 - 112 mEq/L   CO2 22  19 - 32 mEq/L   Glucose, Bld 122 (*) 70 - 99 mg/dL   BUN 10  6 - 23 mg/dL   Creatinine, Ser 9.62  0.50 - 1.10 mg/dL   Calcium 9.1  8.4 - 95.2 mg/dL   Total Protein 7.3  6.0 - 8.3 g/dL   Albumin 3.1 (*) 3.5 - 5.2 g/dL   AST 12  0 - 37 U/L   ALT 11  0 - 35 U/L   Alkaline Phosphatase 63  39 - 117 U/L   Total Bilirubin 0.3  0.3 - 1.2 mg/dL   GFR calc non Af Amer >90  >90 mL/min   GFR calc Af Amer >90  >90 mL/min  PROTIME-INR      Result Value Range   Prothrombin Time 12.9  11.6 - 15.2 seconds   INR 0.99  0.00 - 1.49  URINALYSIS, ROUTINE W REFLEX MICROSCOPIC  Result Value Range   Color, Urine YELLOW  YELLOW   APPearance CLOUDY (*) CLEAR   Specific Gravity, Urine 1.030  1.005 - 1.030   pH 5.5  5.0 - 8.0   Glucose, UA NEGATIVE  NEGATIVE mg/dL   Hgb urine dipstick LARGE (*) NEGATIVE   Bilirubin Urine NEGATIVE  NEGATIVE   Ketones, ur NEGATIVE  NEGATIVE mg/dL   Protein, ur NEGATIVE  NEGATIVE mg/dL   Urobilinogen, UA 0.2  0.0 - 1.0 mg/dL   Nitrite NEGATIVE  NEGATIVE   Leukocytes, UA MODERATE (*) NEGATIVE  URINE MICROSCOPIC-ADD ON      Result Value Range   Squamous Epithelial / LPF RARE  RARE   WBC, UA 3-6  <3 WBC/hpf   RBC / HPF 0-2  <3 RBC/hpf   Bacteria, UA FEW (*) RARE   Urine-Other LESS THAN 10 mL OF URINE SUBMITTED    POCT PREGNANCY, URINE      Result Value Range   Preg Test, Ur NEGATIVE  NEGATIVE   Ct Abdomen Pelvis W Contrast  03/18/2013   CLINICAL DATA:  Nausea and vomiting. Rectal bleeding. Palpitations. Initial encounter.  EXAM: CT ABDOMEN AND PELVIS WITH CONTRAST  TECHNIQUE: Multidetector CT imaging of the abdomen and pelvis was performed using the standard protocol following bolus administration of intravenous contrast.  CONTRAST:  50mL OMNIPAQUE IOHEXOL 300 MG/ML SOLN, OMNIPAQUE IOHEXOL 300 MG/ML SOLN  COMPARISON:  Acute abdominal series 12/09/2010. Pelvic ultrasound delete  that abdominal ultrasound 10/15/2012.  FINDINGS: The lung bases are clear without focal nodule, mass, or airspace disease. The heart size is normal. No significant pleural or pericardial effusion is evident.  An indeterminate 19 mm hypodense lesion is present in the dome of the liver. No other focal hepatic lesions are evident. The spleen is within normal limits. The stomach, duodenum, and pancreas are within normal limits. The common bile duct and gallbladder are normal. The adrenal glands are normal bilaterally. The kidneys and ureters are within normal limits.  Rectosigmoid colon is within normal limits. The remainder of the colon is unremarkable. The appendix is visualized and normal. A 27 mm fibroid is evident. The uterus is otherwise unremarkable. The adnexa are within normal limits for age. No significant adenopathy or free fluid is evident.  The bone windows are within normal limits. No focal lytic or blastic lesions are present.  IMPRESSION: 1. No acute or focal abnormality to explain the patient's symptoms. 2. Indeterminate 19 mm hyperdense lesion at the dome of the liver. A hyperechoic lesion in the same region on the recent ultrasound corresponds with a hemangioma.   Electronically Signed   By: Gennette Pac   On: 03/18/2013 08:02      Trixie Dredge, PA-C 03/18/13 [phone]

## 2013-03-19 LAB — URINE CULTURE: Colony Count: 85000

## 2013-03-25 NOTE — ED Provider Notes (Signed)
Medical screening examination/treatment/procedure(s) were performed by non-physician practitioner and as supervising physician I was immediately available for consultation/collaboration.   William Mehlani Blankenburg, MD 03/25/13 0704 

## 2013-03-25 NOTE — ED Provider Notes (Signed)
Medical screening examination/treatment/procedure(s) were performed by non-physician practitioner and as supervising physician I was immediately available for consultation/collaboration.   Dagmar Hait, MD 03/25/13 [phone]

## 2013-03-30 ENCOUNTER — Other Ambulatory Visit: Payer: Self-pay | Admitting: Gynecology

## 2013-05-09 ENCOUNTER — Emergency Department (HOSPITAL_COMMUNITY)
Admission: EM | Admit: 2013-05-09 | Discharge: 2013-05-10 | Disposition: A | Discharge status: Home / Self Care | Payer: BC Managed Care – PPO | Attending: Emergency Medicine | Admitting: Emergency Medicine

## 2013-05-09 ENCOUNTER — Emergency Department (HOSPITAL_COMMUNITY): Payer: BC Managed Care – PPO

## 2013-05-09 ENCOUNTER — Encounter (HOSPITAL_COMMUNITY): Payer: Self-pay | Admitting: Emergency Medicine

## 2013-05-09 DIAGNOSIS — F411 Generalized anxiety disorder: Secondary | ICD-10-CM | POA: Insufficient documentation

## 2013-05-09 DIAGNOSIS — Z79899 Other long term (current) drug therapy: Secondary | ICD-10-CM | POA: Insufficient documentation

## 2013-05-09 DIAGNOSIS — R000 Tachycardia, unspecified: Secondary | ICD-10-CM

## 2013-05-09 DIAGNOSIS — Z8619 Personal history of other infectious and parasitic diseases: Secondary | ICD-10-CM | POA: Insufficient documentation

## 2013-05-09 DIAGNOSIS — Z872 Personal history of diseases of the skin and subcutaneous tissue: Secondary | ICD-10-CM | POA: Insufficient documentation

## 2013-05-09 DIAGNOSIS — Z8632 Personal history of gestational diabetes: Secondary | ICD-10-CM | POA: Insufficient documentation

## 2013-05-09 DIAGNOSIS — F419 Anxiety disorder, unspecified: Secondary | ICD-10-CM

## 2013-05-09 LAB — CBC WITH DIFFERENTIAL/PLATELET
Eosinophils Absolute: 0.1 10*3/uL (ref 0.0–0.7)
Eosinophils Relative: 1 % (ref 0–5)
HCT: 40.4 % (ref 36.0–46.0)
Lymphocytes Relative: 39 % (ref 12–46)
Lymphs Abs: 4 10*3/uL (ref 0.7–4.0)
MCH: 28.2 pg (ref 26.0–34.0)
MCV: 85.8 fL (ref 78.0–100.0)
Monocytes Absolute: 1.1 10*3/uL — ABNORMAL HIGH (ref 0.1–1.0)
Monocytes Relative: 11 % (ref 3–12)
Platelets: 286 10*3/uL (ref 150–400)
RBC: 4.71 MIL/uL (ref 3.87–5.11)
WBC: 10.2 10*3/uL (ref 4.0–10.5)

## 2013-05-09 LAB — COMPREHENSIVE METABOLIC PANEL
ALT: 17 U/L (ref 0–35)
BUN: 14 mg/dL (ref 6–23)
CO2: 26 mEq/L (ref 19–32)
Calcium: 9.5 mg/dL (ref 8.4–10.5)
Creatinine, Ser: 0.88 mg/dL (ref 0.50–1.10)
GFR calc Af Amer: >90 mL/min (ref >90)
GFR calc non Af Amer: 84 mL/min — ABNORMAL LOW (ref >90)
Glucose, Bld: 98 mg/dL (ref 70–99)
Total Protein: 8.1 g/dL (ref 6.0–8.3)

## 2013-05-09 LAB — D-DIMER, QUANTITATIVE: D-Dimer, Quant: <0.27 ug/mL-FEU (ref 0.00–0.48)

## 2013-05-09 MED ORDER — LORAZEPAM 1 MG PO TABS: 1.0000 mg | ORAL_TABLET | Freq: Once | ORAL | Status: DC

## 2013-05-09 MED ORDER — KETOROLAC TROMETHAMINE 30 MG/ML IJ SOLN: 30.0000 mg | Freq: Once | INTRAMUSCULAR | Status: DC

## 2013-05-09 MED ORDER — ESCITALOPRAM OXALATE 10 MG PO TABS
20.0000 mg | ORAL_TABLET | Freq: Once | ORAL | Status: AC
  Administered 2013-05-09: 20 mg via ORAL
  Filled 2013-05-09: qty 2

## 2013-05-09 MED ORDER — FENTANYL CITRATE 0.05 MG/ML IJ SOLN: 100.0000 ug | Freq: Once | INTRAMUSCULAR | Status: DC

## 2013-05-09 NOTE — ED Notes (Signed)
Pt refusing medication at this time, reports she would like to try and "calm down by herself."

## 2013-05-09 NOTE — ED Notes (Signed)
CT made aware Angio has been d/c'd at this time until D-dimer results

## 2013-05-09 NOTE — ED Notes (Signed)
Pt arrived to the ED with a sudden onset of chest pain.  Pt states she was at a ball game when she felt chest pain with a shortness of breath episode that followed.  Pt states pain is on the left side and radiates to her back and the back of her arm.  Pt symptoms started at 2000hrs

## 2013-05-09 NOTE — ED Provider Notes (Signed)
CSN: [id number]     Arrival date & time 05/09/13  2023 History   First MD Initiated Contact with Patient 05/09/13 2045     Chief Complaint  Patient presents with  . Chest Pain   (Consider location/radiation/quality/duration/timing/severity/associated sxs/prior Treatment) The history is provided by the patient.   The patient presents to the emergency department for evaluation of acute onset of heart racing, right scapular pain and shortness of breath. The incident happened around 8 PM while she was at her son's basketball game. She says that she gets very involved in the games and very excited. She is supposed to take Lexapro but has missed doses the last 2 days and has not had it. She has a prescription for xanax but did not bring this with her. The patient has no URI symptoms, fevers, nausea, vomiting, diarrhea, chills, headache. She is no longer having shortness of breath and her heart is no longer racing. She is unsure as to whether she had a panic attack or not. She denies having history of chest pain, she has never seen a cardiologist. She does not smoke and or drink. Past Medical History  Diagnosis Date  . Chlamydia 2009  . Hx gestational diabetes   . Anxiety   . Leiomyoma 2013    small   Past Surgical History  Procedure Laterality Date  . Mouth surgery  1996   Family History  Problem Relation Age of Onset  . Hypertension Mother   . Breast cancer Paternal Aunt 21  . Ovarian cancer Maternal Grandmother   . Diabetes Paternal Grandmother   . Hypertension Father     borderline  . Diabetes Father    History  Substance Use Topics  . Smoking status: Never Smoker   . Smokeless tobacco: Never Used  . Alcohol Use: Yes     Comment: socially   OB History   Grav Para Term Preterm Abortions TAB SAB Ect Mult Living   2 1 1  1  1   1      Review of Systems The patient denies anorexia, fever, weight loss,, vision loss, decreased hearing, hoarseness, chest pain, syncope, dyspnea on  exertion, peripheral edema, balance deficits, hemoptysis, abdominal pain, melena, hematochezia, severe indigestion/heartburn, hematuria, incontinence, genital sores, muscle weakness, suspicious skin lesions, transient blindness, difficulty walking, depression, unusual weight change, abnormal bleeding, enlarged lymph nodes, angioedema, and breast masses.  Allergies  Codeine; Sulfa antibiotics; Ciprofloxacin; and Prednisone  Home Medications   Current Outpatient Rx  Name  Route  Sig  Dispense  Refill  . ALPRAZolam (XANAX) 0.25 MG tablet   Oral   Take 0.25 mg by mouth at bedtime as needed for sleep or anxiety.         Marland Kitchen escitalopram (LEXAPRO) 20 MG tablet   Oral   Take 20 mg by mouth every morning.          . Multiple Vitamin (MULTIVITAMIN WITH MINERALS) TABS tablet   Oral   Take 1 tablet by mouth every morning.         . norgestimate-ethinyl estradiol (ORTHO-CYCLEN,SPRINTEC,PREVIFEM) 0.25-35 MG-MCG tablet   Oral   Take 1 tablet by mouth every morning.          BP 125/84  Pulse 101  Temp(Src) 99.5 F (37.5 C) (Oral)  Resp 16  SpO2 99%  LMP 05/09/2013 Physical Exam  Nursing note and vitals reviewed. Constitutional: She appears well-developed and well-nourished. No distress.  HENT:  Head: Normocephalic and atraumatic.  Eyes: Pupils are equal,  round, and reactive to light.  Neck: Normal range of motion. Neck supple.  Cardiovascular: Normal rate and regular rhythm.   Pulmonary/Chest: Effort normal and breath sounds normal. She exhibits no tenderness, no bony tenderness, no crepitus and no retraction.  Abdominal: Soft.  Neurological: She is alert.  Skin: Skin is warm and dry.    ED Course  Procedures (including critical care time) Labs Review Labs Reviewed  CBC WITH DIFFERENTIAL - Abnormal; Notable for the following:    Monocytes Absolute 1.1 (*)    All other components within normal limits  COMPREHENSIVE METABOLIC PANEL - Abnormal; Notable for the following:     Total Bilirubin 0.2 (*)    GFR calc non Af Amer 84 (*)    All other components within normal limits  D-DIMER, QUANTITATIVE  PROTIME-INR   Imaging Review Dg Chest 2 View  05/09/2013   CLINICAL DATA:  Sudden onset of chest pain  EXAM: CHEST  2 VIEW  COMPARISON:  10/15/2012  FINDINGS: The heart size and mediastinal contours are within normal limits. Both lungs are clear. The visualized skeletal structures are unremarkable.  IMPRESSION: No active cardiopulmonary disease.   Electronically Signed   By: Signa Kell M.D.   On: 05/09/2013 21:20    EKG Interpretation   None       MDM   1. Racing heart beat   2. Anxiety    Dr. Oletta Lamas and I discussed the patient. He does not feel that this is cardiac or consistent with a PE. He feels it is most likely anxiety. However, we will check a d-dimer, chest xrays, EKG and basic labs then observe patient.   The patient was offered some anxiety and pain medication, both of which she declined during her stay in the ER. She chose to turn the lights off and try to relax because her anxiety level was high.  Her d-dimer and chest xray were normal. She does not have any acute abnormalities in labs. The patient remained on the monitor and was observed for two hours after all the results return with no adverse events. She feels comfortable going home after obs.   Date: 05/10/2013  Rate: 106  Rhythm: sinus tachy  QRS Axis: normal  Intervals: normal  ST/T Wave abnormalities: normal  Conduction Disutrbances:none  Narrative Interpretation:   Old EKG Reviewed: none available   37 y.o.Marie Wang's evaluation in the Emergency Department is complete. It has been determined that no acute conditions requiring further emergency intervention are present at this time. The patient/guardian have been advised of the diagnosis and plan. We have discussed signs and symptoms that warrant return to the ED, such as changes or worsening in symptoms.  Vital signs are  stable at discharge. Filed Vitals:   05/10/13 0054  BP: 125/84  Pulse: 101  Temp:   Resp: 16    Patient/guardian has voiced understanding and agreed to follow-up with the PCP or specialist.  .    Dorthula Matas, PA-C 05/10/13 1539

## 2013-05-10 NOTE — ED Provider Notes (Signed)
Medical screening examination/treatment/procedure(s) were performed by non-physician practitioner and as supervising physician I was immediately available for consultation/collaboration.  EKG Interpretation     Ventricular Rate:  108 PR Interval:  167 QRS Duration: 75 QT Interval:  341 QTC Calculation: 457 R Axis:   66 Text Interpretation:  Sinus tachycardia Probable left atrial enlargement Since last tracing rate faster Otherwise no significant change              Gavin Pound. Oletta Lamas, MD 05/10/13 2223

## 2013-05-13 ENCOUNTER — Emergency Department (HOSPITAL_COMMUNITY)
Admission: EM | Admit: 2013-05-13 | Discharge: 2013-05-13 | Disposition: A | Discharge status: Home / Self Care | Payer: BC Managed Care – PPO | Attending: Emergency Medicine | Admitting: Emergency Medicine

## 2013-05-13 ENCOUNTER — Encounter (HOSPITAL_COMMUNITY): Payer: Self-pay | Admitting: Emergency Medicine

## 2013-05-13 DIAGNOSIS — Z8632 Personal history of gestational diabetes: Secondary | ICD-10-CM | POA: Insufficient documentation

## 2013-05-13 DIAGNOSIS — J3489 Other specified disorders of nose and nasal sinuses: Secondary | ICD-10-CM | POA: Insufficient documentation

## 2013-05-13 DIAGNOSIS — R040 Epistaxis: Secondary | ICD-10-CM | POA: Insufficient documentation

## 2013-05-13 DIAGNOSIS — F419 Anxiety disorder, unspecified: Secondary | ICD-10-CM

## 2013-05-13 DIAGNOSIS — H532 Diplopia: Secondary | ICD-10-CM | POA: Insufficient documentation

## 2013-05-13 DIAGNOSIS — F411 Generalized anxiety disorder: Secondary | ICD-10-CM | POA: Insufficient documentation

## 2013-05-13 DIAGNOSIS — Z8619 Personal history of other infectious and parasitic diseases: Secondary | ICD-10-CM | POA: Insufficient documentation

## 2013-05-13 DIAGNOSIS — Z79899 Other long term (current) drug therapy: Secondary | ICD-10-CM | POA: Insufficient documentation

## 2013-05-13 DIAGNOSIS — Q759 Congenital malformation of skull and face bones, unspecified: Secondary | ICD-10-CM | POA: Insufficient documentation

## 2013-05-13 DIAGNOSIS — R209 Unspecified disturbances of skin sensation: Secondary | ICD-10-CM | POA: Insufficient documentation

## 2013-05-13 DIAGNOSIS — Z8742 Personal history of other diseases of the female genital tract: Secondary | ICD-10-CM | POA: Insufficient documentation

## 2013-05-13 MED ORDER — SALINE SPRAY 0.65 % NA SOLN: 2.0000 | NASAL | Status: DC | PRN

## 2013-05-13 NOTE — ED Notes (Addendum)
Pt was at sons basketball game on sat and felt heart begin to race and arms went numb. Pt was seen in ED and released. Pt Began to have severe head pressure with nose bleed and right arm and shoulder pain with arm tingling. Pt went to bed and woke up covered in blood and arms and hands were numb. Pt attempted to get up and put on glasses and pt began to see triple. Pt attempted to blow nose but pt states her nose hurts. Pt does not have a hx of nosebleeds prior to Sat. Pt states she noticed chills Monday and Tuesday

## 2013-05-13 NOTE — ED Notes (Signed)
Pt states her face and head hurt. Pt states when she tried to focus on the TV she sees double. Pt is anxious about her symptoms.

## 2013-05-13 NOTE — ED Provider Notes (Signed)
CSN: [id number]     Arrival date & time 05/13/13  1339 History   First MD Initiated Contact with Patient 05/13/13 1355     This chart was scribed for Darnelle Spangle, by Ladona Ridgel Day, ED scribe. This patient was seen in room WTR5/WTR5 and the patient's care was started at 1355.  No chief complaint on file.  The history is provided by the patient. No language interpreter was used.   HPI Comments: Marie Wang is a 37 y.o. female who presents to the Emergency Department complaining of nosebleed last PM with associated pressure over her sinuses that radiates to her head. She states episode of palpitations 4 days ago in which she was seen here and advised it was anxiety related. She states since then has had 2 episodes of nosebleeds and worried that something is wrong in her head. She states most recent nosebleed PTA for about 4 minutes. She state missed her Lexapro Thursday and Friday recently. She states feeling numbness of her right arm. She states usually has sinus congestion but usually does not have sinus pressure this severe. She states not Pain in her head but just Pressure in her head, 8/10. She states having double vision. She has not taken any medicines for this problem and does not use nasal sprays.   Past Medical History  Diagnosis Date  . Chlamydia 2009  . Hx gestational diabetes   . Anxiety   . Leiomyoma 2013    small   Past Surgical History  Procedure Laterality Date  . Mouth surgery  1996   Family History  Problem Relation Age of Onset  . Hypertension Mother   . Breast cancer Paternal Aunt 86  . Ovarian cancer Maternal Grandmother   . Diabetes Paternal Grandmother   . Hypertension Father     borderline  . Diabetes Father    History  Substance Use Topics  . Smoking status: Never Smoker   . Smokeless tobacco: Never Used  . Alcohol Use: Yes     Comment: socially   OB History   Grav Para Term Preterm Abortions TAB SAB Ect Mult Living   2 1 1  1  1   1       Review of Systems  All other systems reviewed and are negative.   A complete 10 system review of systems was obtained and all systems are negative except as noted in the HPI and PMH.   Allergies  Codeine; Sulfa antibiotics; Ciprofloxacin; and Prednisone  Home Medications   Current Outpatient Rx  Name  Route  Sig  Dispense  Refill  . ALPRAZolam (XANAX) 0.25 MG tablet   Oral   Take 0.25 mg by mouth at bedtime as needed for sleep or anxiety.         Marland Kitchen escitalopram (LEXAPRO) 20 MG tablet   Oral   Take 20 mg by mouth every morning.          . Multiple Vitamin (MULTIVITAMIN WITH MINERALS) TABS tablet   Oral   Take 1 tablet by mouth every morning.         . norgestimate-ethinyl estradiol (ORTHO-CYCLEN,SPRINTEC,PREVIFEM) 0.25-35 MG-MCG tablet   Oral   Take 1 tablet by mouth every morning.         . sodium chloride (OCEAN) 0.65 % SOLN nasal spray   Each Nare   Place 2 sprays into both nostrils as needed for congestion.   30 mL   0    Triage Vitals: BP 135/80  Pulse  97  Temp(Src) 98.6 F (37 C) (Oral)  Resp 18  SpO2 97%  LMP 05/09/2013 Physical Exam  Nursing note and vitals reviewed. Constitutional: She is oriented to person, place, and time. She appears well-developed and well-nourished.  Tearful  HENT:  Head: Atraumatic. Macrocephalic.  Right Ear: Hearing, tympanic membrane, external ear and ear canal normal.  Left Ear: Hearing, tympanic membrane, external ear and ear canal normal.  Nose: Mucosal edema present. No rhinorrhea. No epistaxis. Right sinus exhibits maxillary sinus tenderness. Left sinus exhibits maxillary sinus tenderness.  Mouth/Throat: Uvula is midline, oropharynx is clear and moist and mucous membranes are normal.  Eyes: Conjunctivae and EOM are normal. Pupils are equal, round, and reactive to light. Right eye exhibits no discharge. Left eye exhibits no discharge. No scleral icterus.  Neck: Normal range of motion. Neck supple.  Cardiovascular:  Normal rate, regular rhythm and normal heart sounds.   No murmur heard. Pulmonary/Chest: Effort normal and breath sounds normal. No respiratory distress. She has no wheezes.  Musculoskeletal: Normal range of motion.  Neurological: She is alert and oriented to person, place, and time. No cranial nerve deficit. Coordination normal.  CN II-XII in tact, no focal deficit, nl finger to nose coordination. Nl sensation, 5/5 strength in all major muscle groups. Neg romberg and nl gait.   Skin: Skin is warm and dry.  Psychiatric: Her behavior is normal. Her mood appears anxious.  Anxious    ED Course  Procedures (including critical care time) DIAGNOSTIC STUDIES: Oxygen Saturation is 97% on room air, normal by my interpretation.    COORDINATION OF CARE: At 215 PM Discussed treatment plan with patient which includes OTC pain medication and nasal saline. Return precautions provided. Patient agrees.   Labs Review Labs Reviewed - No data to display Imaging Review No results found.  EKG Interpretation   None       MDM   1. Sinus pressure   2. Bleeding nose   3. Anxiety    Pt c/o sinus pressure but denies pain. Hx of anxiety. Pt seen on 11/9 for anxiety type symptoms, had a negative cardiac workup and negative workup for PE.  Pt is tearful today and concerned she has a brain tumor.  Does not understand how this can all be related to anxiety and continuously states she has pressure not pain in her head. Declined anxiety and pain medication offered in ED today.  On further questioning, pt did mention she just started her menstrual cycle after 2 months and wonders if this could be causing these changes in her body.  On exam pt is tearful but normal neuro exam including CN II-XII in tact, negative Romberg and normal gait. Nl EOM, eyes-PERRL  On HENT: pt does have bilateral nasal mucosa edema and tenderness in maxillary sinuses.  Do not believe emergent process is taking place at this time. Do not  believe further workup is indicated at this time.   As sinus pressure just started Saturday and no hx of URI type symptoms, do not believe antibiotics needed at this time.  Advised to use OTC nasal saline as well as acetaminophen and ibuprofen as needed for pain.  F/u in 2 days with PCP. Return precautions provided. Pt verbalized understanding and agreement with tx plan.   I personally performed the services described in this documentation, which was scribed in my presence. The recorded information has been reviewed and is accurate.      Junius Finner, PA-C 05/13/13 1441

## 2013-05-15 NOTE — ED Provider Notes (Signed)
Medical screening examination/treatment/procedure(s) were performed by non-physician practitioner and as supervising physician I was immediately available for consultation/collaboration.  EKG Interpretation   None        Donnetta Hutching, MD 05/15/13 1513

## 2013-06-15 ENCOUNTER — Other Ambulatory Visit: Payer: Self-pay | Admitting: Gynecology

## 2013-06-30 ENCOUNTER — Ambulatory Visit: Payer: BC Managed Care – PPO | Admitting: Gynecology

## 2013-07-01 ENCOUNTER — Ambulatory Visit (INDEPENDENT_AMBULATORY_CARE_PROVIDER_SITE_OTHER): Payer: BC Managed Care – PPO | Admitting: Gynecology

## 2013-07-01 ENCOUNTER — Encounter: Payer: Self-pay | Admitting: Gynecology

## 2013-07-01 DIAGNOSIS — N926 Irregular menstruation, unspecified: Secondary | ICD-10-CM

## 2013-07-01 DIAGNOSIS — N920 Excessive and frequent menstruation with regular cycle: Secondary | ICD-10-CM

## 2013-07-01 LAB — CBC WITH DIFFERENTIAL/PLATELET
Basophils Absolute: 0.1 10*3/uL (ref 0.0–0.1)
Basophils Relative: 1 % (ref 0–1)
Eosinophils Absolute: 0.1 10*3/uL (ref 0.0–0.7)
Eosinophils Relative: 1 % (ref 0–5)
HCT: 35.7 % — ABNORMAL LOW (ref 36.0–46.0)
Lymphocytes Relative: 46 % (ref 12–46)
Lymphs Abs: 4.3 10*3/uL — ABNORMAL HIGH (ref 0.7–4.0)
MCH: 27.7 pg (ref 26.0–34.0)
MCHC: 32.5 g/dL (ref 30.0–36.0)
MCV: 85.2 fL (ref 78.0–100.0)
Monocytes Absolute: 0.7 10*3/uL (ref 0.1–1.0)
Platelets: 321 10*3/uL (ref 150–400)
RDW: 13.4 % (ref 11.5–15.5)

## 2013-07-01 LAB — TSH: TSH: 1.417 u[IU]/mL (ref 0.350–4.500)

## 2013-07-01 MED ORDER — MEGESTROL ACETATE 20 MG PO TABS: 20.0000 mg | ORAL_TABLET | Freq: Every day | ORAL | Status: DC

## 2013-07-01 NOTE — Progress Notes (Signed)
The patient presents with a long history of irregular menses. Had trial of Mirena IUD but had to discontinue this due to a lot of side effects including irregular bleeding, headaches, cramping. Trial of NuvaRing not tolerated. Has been on several different oral contraceptives. Had sonohysterogram 05/2012 which showed several small myomas but the cavity appeared normal. Endometrial sample suggested polyps. Was scheduled for hysteroscopy D&C but she canceled this and her menses have regulated up until this past July when she had an irregular bleeding episode she restarted birth control pills at that point and had regular menses up until the last several weeks where she has bled on and off continuously. She subsequently has stopped her birth control pills. Denies pregnancy possibilities.  Exam was Administrator, [employer] vagina with light menses flow. Cervix normal. Uterus grossly normal midline mobile nontender. Adnexa without masses or tenderness.  Assessment and plan: Irregular heavy menses area check baseline CBC TSH FSH prolactin hCG. Followup with sonohysterogram to reassess the cavity. Whether to proceed with hysteroscopy D&C regardless also reviewed. We'll rediscuss after ultrasound. Start on May 8 20 mg twice a day times several days then daily through her ultrasound. Condom contraception at present. Long-term management of irregular menses reviewed to include continued PCP manipulation. More aggressive progesterone suppression such as Depo-Provera, reattempt at Mirena IUD. Unsure whether she wants to pursue pregnancy in the future which at this point with eliminated endometrial ablation for hysterectomy from the decision tree. Patient's going to give that more thought also in the interim and followup for lab results and ultrasound.

## 2013-07-01 NOTE — Patient Instructions (Signed)
followup for ultrasound as scheduled. Start on Megace medication twice daily for several days and the bleeding slows and then daily until the ultrasound.

## 2013-07-02 LAB — HCG, SERUM, QUALITATIVE: Preg, Serum: NEGATIVE

## 2013-07-02 LAB — PROLACTIN: Prolactin: 15.1 ng/mL

## 2013-07-02 LAB — FOLLICLE STIMULATING HORMONE: FSH: 3.8 m[IU]/mL

## 2013-07-06 ENCOUNTER — Other Ambulatory Visit: Payer: Self-pay | Admitting: Gynecology

## 2013-07-06 ENCOUNTER — Telehealth: Payer: Self-pay | Admitting: *Deleted

## 2013-07-06 DIAGNOSIS — N920 Excessive and frequent menstruation with regular cycle: Secondary | ICD-10-CM

## 2013-07-06 DIAGNOSIS — N926 Irregular menstruation, unspecified: Secondary | ICD-10-CM

## 2013-07-06 NOTE — Telephone Encounter (Signed)
Pt is currently taking megace 20 mg daily given on OV 07/01/13, pt asked if okay to take with birth control pills? In note states patient has stopped but now going to restart. Please advise

## 2013-07-06 NOTE — Telephone Encounter (Signed)
Left the below note on pt voicemail. 

## 2013-07-06 NOTE — Telephone Encounter (Signed)
I would wait until after the sonohysterogram and then we can discuss.

## 2013-07-20 ENCOUNTER — Ambulatory Visit (INDEPENDENT_AMBULATORY_CARE_PROVIDER_SITE_OTHER): Payer: BC Managed Care – PPO | Admitting: Gynecology

## 2013-07-20 ENCOUNTER — Ambulatory Visit (INDEPENDENT_AMBULATORY_CARE_PROVIDER_SITE_OTHER): Payer: BC Managed Care – PPO

## 2013-07-20 ENCOUNTER — Encounter: Payer: Self-pay | Admitting: Gynecology

## 2013-07-20 ENCOUNTER — Other Ambulatory Visit: Payer: Self-pay | Admitting: Gynecology

## 2013-07-20 DIAGNOSIS — N926 Irregular menstruation, unspecified: Secondary | ICD-10-CM

## 2013-07-20 DIAGNOSIS — D251 Intramural leiomyoma of uterus: Secondary | ICD-10-CM

## 2013-07-20 DIAGNOSIS — N8300 Follicular cyst of ovary, unspecified side: Secondary | ICD-10-CM

## 2013-07-20 DIAGNOSIS — D259 Leiomyoma of uterus, unspecified: Secondary | ICD-10-CM

## 2013-07-20 DIAGNOSIS — D252 Subserosal leiomyoma of uterus: Secondary | ICD-10-CM

## 2013-07-20 DIAGNOSIS — N920 Excessive and frequent menstruation with regular cycle: Secondary | ICD-10-CM

## 2013-07-20 NOTE — Progress Notes (Signed)
Patient presents for sonohysterogram.  History of irregular menses as outlined in 07/01/2013 note. Prior sonohysterogram was negative but biopsy suggested endometrial polyps. Was recommended to repeat the sonohysterogram now. Had been on oral contraceptives doing well until recently with prolonged bleeding. She stopped the birth control pills and was started on Megace which stopped her bleeding.  Ultrasound shows uterus generous in size. Multiple small myomas noted the largest 29 mm with 4 measured, the smaller 16 mm. Endometrial echo 1.7 mm. 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 Cul-de-sac negative.   Sonohysterogram performed, sterile technique, easy catheter introduction, good distention with no abnormalities. Endometrial sample taken. Patient tolerated well.  Assessment and plan: Irregular menses. Leiomyomata. Patient wants to reinitiate oral contraceptives at present. She'll go ahead and do this today and she has been off of them. She is just finishing her Megace and we'll stop this and start the oral contraceptives. Followup for biopsy results. Assuming negative and will monitor. She asked about pregnancy trial. I reviewed with her that she would then need to stop her oral contraceptives, make sure that she is on a multivitamin with folate acid and attempt pregnancy. If significant irregular bleeding and/or remains without pregnancy then she would represent for further evaluation. Ultimately if she decided against pregnancy trial and did not tolerate the oral contraceptives alternatives reviewed. She did not tolerate a Mirena IUD previously due to discomfort and irregular bleeding. She also did not tolerate the NuvaRing. Progesterone only up to and including hysterectomy was also reviewed. Patient will reinitiate birth control pills and then we'll go from there.

## 2013-07-20 NOTE — Patient Instructions (Addendum)
Office will call you with the biopsy results. Restart your birth control pills as we discussed. Followup if any continued irregular bleeding.

## 2013-11-05 ENCOUNTER — Encounter: Payer: Self-pay | Admitting: Gynecology

## 2013-11-05 ENCOUNTER — Ambulatory Visit (INDEPENDENT_AMBULATORY_CARE_PROVIDER_SITE_OTHER): Payer: BC Managed Care – PPO | Admitting: Gynecology

## 2013-11-05 DIAGNOSIS — N644 Mastodynia: Secondary | ICD-10-CM

## 2013-11-05 NOTE — Progress Notes (Signed)
Marie Wang [birth year] [id number]        37 y.o.  G2P1011 presents complaining of several weeks of left axillary discomfort that radiates down into her left tail of [place]. Started at the time of her ovulation. No palpable masses. No history of same before. No nipple discharge. Currently attempting pregnancy over the last 5 months with regular monthly menses with no luminal symptoms.  Past medical history,surgical history, problem list, medications, allergies, family history and social history were all reviewed and documented in the EPIC chart.  Directed ROS with pertinent positives and negatives documented in the history of present illness/assessment and plan.  Exam: [name] assistant General appearance  Normal Both breasts examined lying and sitting without masses retractions discharge adenopathy. The area she's pointing to his up into the left axilla.  Assessment/Plan:  38 y.o. G2P1011 left axillary discomfort radiating to her left breast. Started with ovulation.  Exam is normal.  Recommended observation for now. Assuming it resolves and will follow. If it persists she knows to call me and we'll pursue a more involved evaluation. I reviewed her ovulatory history it appears that she is ovulating. She's been trying for 5 months. I recommended she continue to try of the remainder of this year. If about pregnancy then we'll pursue a more involved evaluation to include semen analysis and HSG. She is on a prenatal vitamin.   Note: This document was prepared with digital dictation and possible smart phrase technology. Any transcriptional errors that result from this process are unintentional.   Anastasio Auerbach MD, 9:30 AM 11/05/2013

## 2013-11-05 NOTE — Patient Instructions (Signed)
Call if the left breast tenderness continues. Followup if you're not pregnant within the next 5-6 months.

## 2013-11-30 ENCOUNTER — Encounter: Payer: Self-pay | Admitting: Gynecology

## 2013-11-30 ENCOUNTER — Ambulatory Visit (INDEPENDENT_AMBULATORY_CARE_PROVIDER_SITE_OTHER): Payer: BC Managed Care – PPO | Admitting: Gynecology

## 2013-11-30 DIAGNOSIS — N926 Irregular menstruation, unspecified: Secondary | ICD-10-CM

## 2013-11-30 LAB — CBC WITH DIFFERENTIAL/PLATELET
BASOS ABS: 0 10*3/uL (ref 0.0–0.1)
BASOS PCT: 0 % (ref 0–1)
EOS ABS: 0.1 10*3/uL (ref 0.0–0.7)
Eosinophils Relative: 1 % (ref 0–5)
HCT: 38 % (ref 36.0–46.0)
Hemoglobin: 12.4 g/dL (ref 12.0–15.0)
Lymphocytes Relative: 41 % (ref 12–46)
Lymphs Abs: 3 10*3/uL (ref 0.7–4.0)
MCH: 27.4 pg (ref 26.0–34.0)
MCHC: 32.6 g/dL (ref 30.0–36.0)
MCV: 83.9 fL (ref 78.0–100.0)
Monocytes Absolute: 0.5 10*3/uL (ref 0.1–1.0)
Monocytes Relative: 7 % (ref 3–12)
Neutro Abs: 3.8 10*3/uL (ref 1.7–7.7)
Neutrophils Relative %: 51 % (ref 43–77)
PLATELETS: 310 10*3/uL (ref 150–400)
RBC: 4.53 MIL/uL (ref 3.87–5.11)
RDW: 14.7 % (ref 11.5–15.5)
WBC: 7.4 10*3/uL (ref 4.0–10.5)

## 2013-11-30 MED ORDER — MEDROXYPROGESTERONE ACETATE 10 MG PO TABS: 10.0000 mg | ORAL_TABLET | Freq: Two times a day (BID) | ORAL | Status: DC

## 2013-11-30 NOTE — Patient Instructions (Signed)
Take Provera medication twice daily for 5 days. Followup for ultrasound as scheduled.  Clomiphene tablets What is this medicine? CLOMIPHENE (KLOE mi feen) is a fertility drug that increases the chance of pregnancy. It helps women ovulate (produce a mature egg) during their cycle. This medicine may be used for other purposes; ask your health care provider or pharmacist if you have questions. COMMON BRAND NAME(S): Clomid, Serophene What should I tell my health care provider before I take this medicine? They need to know if you have any of these conditions: -adrenal gland disease -blood vessel disease or blood clots -cyst on the ovary -endometriosis -liver disease -ovarian cancer -pituitary gland disease -vaginal bleeding that has not been evaluated -an unusual or allergic reaction to clomiphene, other medicines, foods, dyes, or preservatives -pregnant (should not be used if you are already pregnant) -breast-feeding How should I use this medicine? Take this medicine by mouth with a glass of water. Follow the directions on the prescription label. Take exactly as directed for the exact number of days prescribed. Take your doses at regular intervals. Most women take this medicine for a 5 day period, but the length of treatment may be adjusted. Your doctor will give you a start date for this medication and will give you instructions on proper use. Do not take your medicine more often than directed. Talk to your pediatrician regarding the use of this medicine in children. Special care may be needed. Overdosage: If you think you have taken too much of this medicine contact a poison control center or emergency room at once. NOTE: This medicine is only for you. Do not share this medicine with others. What if I miss a dose? If you miss a dose, take it as soon as you can. If it is almost time for your next dose, take only that dose. Do not take double or extra doses. What may interact with this  medicine? -herbal or dietary supplements, like blue cohosh, black cohosh, chasteberry, or DHEA -prasterone This list may not describe all possible interactions. Give your health care provider a list of all the medicines, herbs, non-prescription drugs, or dietary supplements you use. Also tell them if you smoke, drink alcohol, or use illegal drugs. Some items may interact with your medicine. What should I watch for while using this medicine? Make sure you understand how and when to use this medicine. You need to know when you are ovulating and when to have sexual intercourse. This will increase the chance of a pregnancy. Visit your doctor or health care professional for regular checks on your progress. You may need tests to check the hormone levels in your blood or you may have to use home-urine tests to check for ovulation. Try to keep any appointments. Compared to other fertility treatments, this medicine does not greatly increase your chances of having multiple babies. An increased chance of having twins may occur in roughly 5 out of every 100 women who take this medication. Stop taking this medicine at once and contact your doctor or health care professional if you think you are pregnant. This medicine is not for long-term use. Most women that benefit from this medicine do so within the first three cycles (months). Your doctor or health care professional will monitor your condition. This medicine is usually used for a total of 6 cycles of treatment. You may get drowsy or dizzy. Do not drive, use machinery, or do anything that needs mental alertness until you know how this drug affects you.  Do not stand or sit up quickly. This reduces the risk of dizzy or fainting spells. Drinking alcoholic beverages or smoking tobacco may decrease your chance of becoming pregnant. Limit or stop alcohol and tobacco use during your fertility treatments. What side effects may I notice from receiving this medicine? Side  effects that you should report to your doctor or health care professional as soon as possible: -allergic reactions like skin rash, itching or hives, swelling of the face, lips, or tongue -breathing problems -changes in vision -fluid retention -nausea, vomiting -pelvic pain or bloating -severe abdominal pain -sudden weight gain Side effects that usually do not require medical attention (report to your doctor or health care professional if they continue or are bothersome): -breast discomfort -hot flashes -mild pelvic discomfort -mild nausea This list may not describe all possible side effects. Call your doctor for medical advice about side effects. You may report side effects to FDA at 1-800-FDA-1088. Where should I keep my medicine? Keep out of the reach of children. Store at room temperature between 15 and 30 degrees C (59 and 86 degrees F). Protect from heat, light, and moisture. Throw away any unused medicine after the expiration date. NOTE: This sheet is a summary. It may not cover all possible information. If you have questions about this medicine, talk to your doctor, pharmacist, or health care provider.  2014, [employer]vier/[employer] Standard. (2007-09-29 22:21:06)

## 2013-11-30 NOTE — Progress Notes (Signed)
Marie Wang May 17, 1976 [id number]        37 y.o.  G2P1011 presents complaining of irregular bleeding. Patient was evaluated this past December/January 2015 for irregular bleeding. Workup included negative FSH TSH prolactin and hCG. Sonohysterogram showed multiple small myomas but negative endometrial cavity with endometrial echo 1.7 mm. Endometrial sample showed endometrioid polyp.  Options for hysteroscopy versus menstrual calendar reviewed and she subsequent had regular monthly menses through LMP 11/19/2013 where she started a week or early and has bled on and off since then. Also having cramping and some other hormonal side effects such as breast tenderness bloating and lightheadedness. Her husband are attempting pregnancy now over the past 5-6 months.  Past medical history,surgical history, problem list, medications, allergies, family history and social history were all reviewed and documented in the EPIC chart.  Directed ROS with pertinent positives and negatives documented in the history of present illness/assessment and plan.  Exam: [name] assistant General appearance  Normal Abdomen soft nontender without masses guarding rebound Pelvic external BUS vagina normal without active bleeding. Cervix normal with mucoid blood loss. Uterus normal size midline mobile nontender. Adnexa without masses or tenderness.  Assessment/Plan:  38 y.o. G2P1011 irregular menses. Similar irregular bleeding in the past but regulated spontaneously in February/March/April. Began a week early in May has bled on and off since then. Check CBC and hCG. Provera 10 mg twice a day x5 days. Prior sonohysterogram was negative for the endometrial cavity but did show multiple small myomas. Biopsy did show an endometrioid polyp. Repeat sonohysterogram now. Also reviewed options for pregnancy trial to include ovulatory stimulants such as Clomid. It appears that she is irregularly ovulating of the past year with these episodes of  irregular bleeding. What is involved with Clomid treatment to include how to take the medication and the risks including multiple gestation, hyper stimulation and possible ovarian cancer which was all reviewed with her and her husband. Patient and her husband will think about this and followup for her sonohysterogram and then we'll further discuss following this.   Note: This document was prepared with digital dictation and possible smart phrase technology. Any transcriptional errors that result from this process are unintentional.   Anastasio Auerbach MD, 11:04 AM 11/30/2013

## 2013-12-01 LAB — HCG, SERUM, QUALITATIVE: Preg, Serum: NEGATIVE

## 2013-12-07 ENCOUNTER — Other Ambulatory Visit: Payer: Self-pay | Admitting: Gynecology

## 2013-12-07 DIAGNOSIS — D259 Leiomyoma of uterus, unspecified: Secondary | ICD-10-CM

## 2013-12-07 DIAGNOSIS — N921 Excessive and frequent menstruation with irregular cycle: Secondary | ICD-10-CM

## 2013-12-07 DIAGNOSIS — N926 Irregular menstruation, unspecified: Secondary | ICD-10-CM

## 2013-12-08 ENCOUNTER — Other Ambulatory Visit: Payer: Self-pay | Admitting: Gynecology

## 2013-12-09 ENCOUNTER — Other Ambulatory Visit: Payer: BC Managed Care – PPO

## 2013-12-09 ENCOUNTER — Ambulatory Visit: Payer: BC Managed Care – PPO | Admitting: Gynecology

## 2013-12-14 ENCOUNTER — Other Ambulatory Visit: Payer: Self-pay | Admitting: Gynecology

## 2013-12-14 ENCOUNTER — Ambulatory Visit (INDEPENDENT_AMBULATORY_CARE_PROVIDER_SITE_OTHER): Payer: BC Managed Care – PPO | Admitting: Gynecology

## 2013-12-14 ENCOUNTER — Encounter: Payer: Self-pay | Admitting: Gynecology

## 2013-12-14 ENCOUNTER — Ambulatory Visit (INDEPENDENT_AMBULATORY_CARE_PROVIDER_SITE_OTHER): Payer: BC Managed Care – PPO

## 2013-12-14 DIAGNOSIS — N921 Excessive and frequent menstruation with irregular cycle: Secondary | ICD-10-CM

## 2013-12-14 DIAGNOSIS — E282 Polycystic ovarian syndrome: Secondary | ICD-10-CM

## 2013-12-14 DIAGNOSIS — N926 Irregular menstruation, unspecified: Secondary | ICD-10-CM

## 2013-12-14 DIAGNOSIS — D259 Leiomyoma of uterus, unspecified: Secondary | ICD-10-CM

## 2013-12-14 DIAGNOSIS — D251 Intramural leiomyoma of uterus: Secondary | ICD-10-CM

## 2013-12-14 DIAGNOSIS — N920 Excessive and frequent menstruation with regular cycle: Secondary | ICD-10-CM

## 2013-12-14 NOTE — Progress Notes (Signed)
Marie Wang [birth year] [id number]        37 y.o.  G2P1011 presents for sonohysterogram due to irregular bleeding on Megace. Prior evaluations include a normal FSH prolactin TSH and negative hCG. Currently attempting pregnancy on a multivitamin with folic acid.  Past medical history,surgical history, problem list, medications, allergies, family history and social history were all reviewed and documented in the EPIC chart.  Directed ROS with pertinent positives and negatives documented in the history of present illness/assessment and plan.  Ultrasound shows generous uterus with multiple myomas the largest measuring 29 mm. Endometrial echo 2.7 mm. Right left ovaries normal with numerous peripheral follicles. Cul-de-sac negative.  Sonohysterogram performed, sterile technique, easy catheter introduction, good distention with no abnormalities. Endometrial sample taken.  Assessment/Plan:  38 y.o. G2P1011 with irregular bleeding consistent with irregular ovulation. Ultrasound suggestive of a PCOS pattern. I again reviewed with her and her husband the options to include observation with menstrual calendar continued spontaneous pregnancy trial versus Clomid stimulation. We discussed the risks to include hyperstimulation syndrome, multiple gestation and possible ovarian cancer linkage. They still aren't sure whether they want to proceed with this and will think about it and followup for the biopsy results.   Note: This document was prepared with digital dictation and possible smart phrase technology. Any transcriptional errors that result from this process are unintentional.   Anastasio Auerbach MD, 12:07 PM 12/14/2013

## 2013-12-14 NOTE — Patient Instructions (Signed)
Office will call you with biopsy results 

## 2013-12-16 ENCOUNTER — Telehealth: Payer: Self-pay

## 2013-12-16 NOTE — Telephone Encounter (Signed)
Message copied by Ramond Craver on Wed Dec 16, 2013 10:18 AM ------      Message from: Anastasio Auerbach      Created: Tue Dec 15, 2013  5:12 PM       Tell patient that the pathology was benign. They did describe endometrioid type polyps which I think is just a reflection of her PCOS type bleeding pattern. She clearly did not have any significant polyps on sonohysterogram. I would not recommend doing anything further at this time as far as that is concerned. Question is whether she is interested in pursuing Clomid now or not she will need to let me know. ------

## 2013-12-16 NOTE — Telephone Encounter (Signed)
Patient said that she is ready to pursue treatment with Clomid now. She is still spotting she assumes from procedure.  Her period should be due around 12/25/13.  She knows Dr. Loetta Rough out rest of week and may be first of week before I get back with her about this.

## 2013-12-17 ENCOUNTER — Other Ambulatory Visit: Payer: Self-pay | Admitting: Gynecology

## 2013-12-17 MED ORDER — CLOMIPHENE CITRATE 50 MG PO TABS: ORAL_TABLET | ORAL | Status: DC

## 2013-12-17 NOTE — Telephone Encounter (Signed)
Clomid 50 mg #5 one PO days 5-9 of her next cycle.  Call with onset of following cycle or if day 32 without starting.

## 2013-12-17 NOTE — Telephone Encounter (Signed)
Left message to call.

## 2013-12-17 NOTE — Telephone Encounter (Signed)
Patient called. Discussed how to take Clomid and when to call at end of cycle.  Rx sent.

## 2013-12-21 ENCOUNTER — Encounter: Payer: Self-pay | Admitting: Gynecology

## 2013-12-21 ENCOUNTER — Ambulatory Visit (INDEPENDENT_AMBULATORY_CARE_PROVIDER_SITE_OTHER): Payer: BC Managed Care – PPO | Admitting: Gynecology

## 2013-12-21 DIAGNOSIS — N643 Galactorrhea not associated with childbirth: Secondary | ICD-10-CM

## 2013-12-21 DIAGNOSIS — N926 Irregular menstruation, unspecified: Secondary | ICD-10-CM

## 2013-12-21 NOTE — Patient Instructions (Signed)
Office will call you with lab results 

## 2013-12-21 NOTE — Progress Notes (Signed)
Marie Wang Jul 11, 1975 [id number]        38 y.o.  G2P1011 with history of PCOS type pattern with irregular bleeding last fall. Had regular menses for the first several months of this year then began bleeding at the end of May on and off continuously. [place] prolactin and TSH were negative in December 2014. Repeat hCG beginning of June was negative. She had a sonohysterogram in December which was negative but did show endometrioid polyps. We elected for observation at that point. With the onset of her irregular bleeding in May we repeated the sonohysterogram which again showed a thin endometrium at 2.7 mm with no evidence of abnormalities but the biopsy showed "endometrial polyps". The patient ultimately elected to start Clomid and we are awaiting her period which she said is due end of this week but she has continued to bleed on and off since the sonohysterogram and essentially since the end of May. Notes since yesterday bilateral milky to clear galactorrhea and some mild breast tenderness.  Past medical history,surgical history, problem list, medications, allergies, family history and social history were all reviewed and documented in the EPIC chart.  Directed ROS with pertinent positives and negatives documented in the history of present illness/assessment and plan.  Exam: [name] assistant General appearance  Normal Both breasts examined without masses retractions adenopathy. She does have expressive bilateral clear to milky galactorrhea. Pelvic external BUS vagina normal with dark brownish discharge. Cervix normal. Uterus grossly normal size midline mobile nontender. Adnexa without masses or tenderness.  Assessment/Plan:  38 y.o. G2P1011 with continued irregular bleeding. Recheck hCG. Bilateral galactorrhea, never noted before. Check prolactin. Patient will followup these results. Assuming negative she is going to proceed with Clomid once she has a regular type menses. She does not start by next week  then we will plan a progesterone withdrawal. We've previously discussed the risks of Clomid and her husband accept this. If she is early pregnant then she probably conceived right around the time of the sonohysterogram despite all of the irregular bleeding and we will follow serial quantitative hCGs for viability.   Note: This document was prepared with digital dictation and possible smart phrase technology. Any transcriptional errors that result from this process are unintentional.   Anastasio Auerbach MD, 4:12 PM 12/21/2013

## 2013-12-22 LAB — PROLACTIN: Prolactin: 12.7 ng/mL

## 2013-12-22 LAB — HCG, SERUM, QUALITATIVE: Preg, Serum: NEGATIVE

## 2013-12-29 ENCOUNTER — Telehealth: Payer: Self-pay | Admitting: *Deleted

## 2013-12-29 DIAGNOSIS — N912 Amenorrhea, unspecified: Secondary | ICD-10-CM

## 2013-12-29 NOTE — Telephone Encounter (Signed)
Left on voicemail to have lab appointment to have blood drawn, if negative rx will be sent. I will wait for results.

## 2013-12-29 NOTE — Telephone Encounter (Signed)
Check hCG. Assuming negative then Provera 10 mg x10 days.

## 2013-12-29 NOTE — Telephone Encounter (Signed)
Pt called c/o no cycle since 11/24/13, told to call if no cycle. Please advise

## 2013-12-31 ENCOUNTER — Other Ambulatory Visit: Payer: BC Managed Care – PPO

## 2013-12-31 ENCOUNTER — Other Ambulatory Visit: Payer: Self-pay | Admitting: Gynecology

## 2014-01-04 ENCOUNTER — Ambulatory Visit (INDEPENDENT_AMBULATORY_CARE_PROVIDER_SITE_OTHER): Payer: BC Managed Care – PPO | Admitting: Gynecology

## 2014-01-04 ENCOUNTER — Encounter: Payer: Self-pay | Admitting: Gynecology

## 2014-01-04 DIAGNOSIS — N898 Other specified noninflammatory disorders of vagina: Secondary | ICD-10-CM

## 2014-01-04 DIAGNOSIS — N912 Amenorrhea, unspecified: Secondary | ICD-10-CM

## 2014-01-04 LAB — WET PREP FOR TRICH, YEAST, CLUE
Clue Cells Wet Prep HPF POC: NONE SEEN
Trich, Wet Prep: NONE SEEN
Yeast Wet Prep HPF POC: NONE SEEN

## 2014-01-04 MED ORDER — MEDROXYPROGESTERONE ACETATE 10 MG PO TABS: ORAL_TABLET | ORAL | Status: DC

## 2014-01-04 NOTE — Patient Instructions (Signed)
Verify the pregnancy test is negative and then start the progesterone hormone pill twice daily for 5 days to bring on your period. On day 5 of your period start the Clomid one pill daily through day 9 (5 days worth) as we discussed previously.

## 2014-01-04 NOTE — Progress Notes (Signed)
Marie Wang 06/12/1976 [id number]        37 y.o.  G2P1011 presents with last menstrual period 11/19/2013. Had sonohysterogram 12/14/2013 which showed multiple small myomas but no intracavitary abnormalities. Initial biopsy showed polypoid endometrium. HCG negative 12/21/2013. Awaiting menses to start Clomid. Also noted some vaginal discharge. No itching or odor.  Past medical history,surgical history, problem list, medications, allergies, family history and social history were all reviewed and documented in the EPIC chart.  Directed ROS with pertinent positives and negatives documented in the history of present illness/assessment and plan.  Exam: [name] assistant General appearance:  Normal Abdomen: Soft nontender without masses guarding rebound Pelvic: External BUS vagina normal. Cervix normal. Uterus grossly normal midline mobile nontender. Adnexa without gross masses or tenderness.  Assessment/Plan:  38 y.o. G2P1011 with LMP 11/19/2013. History of irregular bleeding before. Recheck hCG today. Assuming negative Provera 10 mg twice a day x5 days. Start Clomid day 5 through 9 with menses. Followup if no menses.   Note: This document was prepared with digital dictation and possible smart phrase technology. Any transcriptional errors that result from this process are unintentional.   Marie Auerbach MD, 3:21 PM 01/04/2014

## 2014-01-05 LAB — HCG, SERUM, QUALITATIVE: PREG SERUM: NEGATIVE

## 2014-01-05 NOTE — Telephone Encounter (Signed)
Pt had OV on 01/04/14

## 2014-01-19 ENCOUNTER — Encounter: Payer: BC Managed Care – PPO | Admitting: Gynecology

## 2014-01-22 ENCOUNTER — Encounter: Payer: Self-pay | Admitting: Gynecology

## 2014-01-22 ENCOUNTER — Ambulatory Visit (INDEPENDENT_AMBULATORY_CARE_PROVIDER_SITE_OTHER): Payer: BC Managed Care – PPO | Admitting: Gynecology

## 2014-01-22 DIAGNOSIS — N912 Amenorrhea, unspecified: Secondary | ICD-10-CM

## 2014-01-22 LAB — GLUCOSE, RANDOM: Glucose, Bld: 85 mg/dL (ref 70–99)

## 2014-01-22 LAB — ESTRADIOL: ESTRADIOL: 93.5 pg/mL

## 2014-01-22 LAB — HCG, SERUM, QUALITATIVE: Preg, Serum: POSITIVE

## 2014-01-22 LAB — FOLLICLE STIMULATING HORMONE: FSH: 4.8 m[IU]/mL

## 2014-01-22 LAB — PROLACTIN: Prolactin: 11.4 ng/mL

## 2014-01-22 NOTE — Patient Instructions (Signed)
Followup for ultrasound as scheduled and lab work.

## 2014-01-22 NOTE — Progress Notes (Signed)
Marie Wang 1975/08/11 [id number]        37 y.o.  G2P1011 presents with no menses since finishing the Provera. History of irregular bleeding with LMP in May. Ultimately was started on Provera 6 July with anticipation to start her Clomid. She reports having spotting for one day after the Provera but did not start the Clomid. Also continuing to have clear to milky galactorrhea. Recent prolactin was normal. Ultrasound previously showed thin endometrial echo.  Past medical history,surgical history, problem list, medications, allergies, family history and social history were all reviewed and documented in the EPIC chart.  Directed ROS with pertinent positives and negatives documented in the history of present illness/assessment and plan.  Exam: Marie Wang assistant General appearance:  Normal Abdomen soft nontender without masses guarding or rebound Pelvic external BUS vagina normal. Cervix normal. Uterus grossly normal midline mobile nontender. Adnexa without masses or tenderness.  Assessment/Plan:  38 y.o. G2P1011 with history of regular menses beginning of this year than irregular periods was treated with progesterone ultimately had last period in May and has not done significant bleeding since then. Previous blood work to include TSH FSH prolactin were normal. Did have spotting for one day after Provera but did not start the Clomid. I reviewed with her I would've preferred her to start it on the fifth day after spotting. We'll check baseline labs today to include prolactin hCG estradiol FSH. Also will check ultrasound real echo. Patient asked if I could check a glucose with a family history of diabetes and I went ahead and ordered that also. We'll followup after ultrasound and then we'll go from there.   Note: This document was prepared with digital dictation and possible smart phrase technology. Any transcriptional errors that result from this process are unintentional.   Marie Auerbach MD,  8:59 AM 01/22/2014

## 2014-01-24 ENCOUNTER — Telehealth: Payer: Self-pay | Admitting: Gynecology

## 2014-01-24 NOTE — Telephone Encounter (Signed)
On Call Note: 10:48 AM  Started spotting dark brown blood yesterday.  Positive HCG 2 days ago.  No significant cramping.  Recommend observe and OV tomorrow AM.  ASAP call precautions before then reviewed.

## 2014-01-25 ENCOUNTER — Other Ambulatory Visit: Payer: Self-pay | Admitting: Gynecology

## 2014-01-25 ENCOUNTER — Ambulatory Visit (INDEPENDENT_AMBULATORY_CARE_PROVIDER_SITE_OTHER): Payer: BC Managed Care – PPO | Admitting: Gynecology

## 2014-01-25 ENCOUNTER — Encounter: Payer: Self-pay | Admitting: Gynecology

## 2014-01-25 ENCOUNTER — Ambulatory Visit: Payer: BC Managed Care – PPO | Admitting: Gynecology

## 2014-01-25 DIAGNOSIS — O200 Threatened abortion: Secondary | ICD-10-CM

## 2014-01-25 NOTE — Patient Instructions (Signed)
Follow-up for blood test results. 

## 2014-01-25 NOTE — Progress Notes (Signed)
Marie Wang [birth year] [id number]        37 y.o.  G2P1011 presents with history as outlined in her 01/22/2014 note. Blood work ordered showed a normal estradiol normal FSH and prolactin and a positive serum qualitative hCG. She started bleeding over this weekend and now is bleeding like a light menses.   Past medical history,surgical history, problem list, medications, allergies, family history and social history were all reviewed and documented in the EPIC chart.  Directed ROS with pertinent positives and negatives documented in the history of present illness/assessment and plan.  Exam: [name] assistant General appearance:  Normal Abdomen soft nontender without masses guarding rebound Pelvic external BUS vagina with light menses flow. Cervix normal and closed. Uterus grossly normal in size midline mobile nontender. Adnexa without masses or tenderness.  Assessment/Plan:  38 y.o. G2P1011 with threatened probable early completing SAB. Noted to have a negative hCG 01/04/2014. Will recheck quantitative hCG today along with ABO Rh. Patient will follow up results. If positive numbers then will follow to negative. If negative then will allow bleeding to resolve awake next menses and start Clomid following her next menses. The issues about whether to start Clomid now given that she had a spontaneous pregnancy was discussed but I think given her total picture starting Clomid now would be appropriate after her next spontaneous menses after this bleeding episode resolves.  Note: This document was prepared with digital dictation and possible smart phrase technology. Any transcriptional errors that result from this process are unintentional.   Anastasio Auerbach MD, 12:21 PM 01/25/2014

## 2014-01-26 ENCOUNTER — Other Ambulatory Visit: Payer: Self-pay | Admitting: Gynecology

## 2014-01-26 DIAGNOSIS — O200 Threatened abortion: Secondary | ICD-10-CM

## 2014-01-26 LAB — HCG, QUANTITATIVE, PREGNANCY: HCG, BETA CHAIN, QUANT, S: 88.9 m[IU]/mL

## 2014-01-26 LAB — ABO AND RH: Rh Type: POSITIVE

## 2014-01-28 ENCOUNTER — Other Ambulatory Visit: Payer: Self-pay | Admitting: Gynecology

## 2014-01-28 ENCOUNTER — Other Ambulatory Visit: Payer: BC Managed Care – PPO

## 2014-01-28 DIAGNOSIS — O200 Threatened abortion: Secondary | ICD-10-CM

## 2014-01-28 LAB — HCG, QUANTITATIVE, PREGNANCY: HCG, BETA CHAIN, QUANT, S: 38.14 m[IU]/mL

## 2014-02-04 ENCOUNTER — Other Ambulatory Visit: Payer: BC Managed Care – PPO

## 2014-02-04 DIAGNOSIS — O200 Threatened abortion: Secondary | ICD-10-CM

## 2014-02-04 LAB — HCG, QUANTITATIVE, PREGNANCY

## 2014-02-18 ENCOUNTER — Encounter: Payer: Self-pay | Admitting: Gynecology

## 2014-02-18 ENCOUNTER — Ambulatory Visit (INDEPENDENT_AMBULATORY_CARE_PROVIDER_SITE_OTHER): Payer: BC Managed Care – PPO

## 2014-02-18 ENCOUNTER — Other Ambulatory Visit: Payer: Self-pay | Admitting: Gynecology

## 2014-02-18 ENCOUNTER — Ambulatory Visit (INDEPENDENT_AMBULATORY_CARE_PROVIDER_SITE_OTHER): Payer: BC Managed Care – PPO | Admitting: Gynecology

## 2014-02-18 DIAGNOSIS — D251 Intramural leiomyoma of uterus: Secondary | ICD-10-CM

## 2014-02-18 DIAGNOSIS — E282 Polycystic ovarian syndrome: Secondary | ICD-10-CM

## 2014-02-18 DIAGNOSIS — D252 Subserosal leiomyoma of uterus: Secondary | ICD-10-CM

## 2014-02-18 DIAGNOSIS — Z8759 Personal history of other complications of pregnancy, childbirth and the puerperium: Secondary | ICD-10-CM

## 2014-02-18 DIAGNOSIS — D259 Leiomyoma of uterus, unspecified: Secondary | ICD-10-CM

## 2014-02-18 DIAGNOSIS — N912 Amenorrhea, unspecified: Secondary | ICD-10-CM

## 2014-02-18 DIAGNOSIS — Z8742 Personal history of other diseases of the female genital tract: Secondary | ICD-10-CM

## 2014-02-18 DIAGNOSIS — O039 Complete or unspecified spontaneous abortion without complication: Secondary | ICD-10-CM

## 2014-02-18 NOTE — Patient Instructions (Signed)
Followup for pregnancy test if without spontaneous menses by beginning of September. Otherwise when starts a regular menses start the Clomid as we have discussed.

## 2014-02-18 NOTE — Progress Notes (Signed)
Marie Wang 05-Jul-1975 [id number]        37 y.o.  G2P1011 percent for ultrasound. Recently had SAB, early on with bleeding. Beta-hCGs were followed to negative 02/04/2014. Blood type is O positive.  Past medical history,surgical history, problem list, medications, allergies, family history and social history were all reviewed and documented in the EPIC chart.  Directed ROS with pertinent positives and negatives documented in the history of present illness/assessment and plan.  Ultrasound shows uterus generous in size. Multiple myomas noted as previously. Right and left ovaries are normal with PCOS type changes. Cul-de-sac negative. Endometrial echo 4.3 mm, faintly trilayered.  Assessment/Plan:  38 y.o. G2P1011 recent spontaneous AB. Patient will await next menses. Our plan is to start Clomid with next menses which we have discussed on multiple occasions and she knows how to take it. If she has not started her menses by the beginning of September she'll followup for pregnancy test.   Note: This document was prepared with digital dictation and possible smart phrase technology. Any transcriptional errors that result from this process are unintentional.   Anastasio Auerbach MD, 3:50 PM 02/18/2014

## 2014-02-21 ENCOUNTER — Other Ambulatory Visit: Payer: Self-pay | Admitting: Gynecology

## 2014-02-24 ENCOUNTER — Encounter: Payer: Self-pay | Admitting: Gynecology

## 2014-02-24 ENCOUNTER — Ambulatory Visit (INDEPENDENT_AMBULATORY_CARE_PROVIDER_SITE_OTHER): Payer: BC Managed Care – PPO | Admitting: Gynecology

## 2014-02-24 DIAGNOSIS — N643 Galactorrhea not associated with childbirth: Secondary | ICD-10-CM

## 2014-02-24 DIAGNOSIS — N898 Other specified noninflammatory disorders of vagina: Secondary | ICD-10-CM

## 2014-02-24 DIAGNOSIS — R300 Dysuria: Secondary | ICD-10-CM

## 2014-02-24 DIAGNOSIS — N912 Amenorrhea, unspecified: Secondary | ICD-10-CM

## 2014-02-24 LAB — URINALYSIS W MICROSCOPIC + REFLEX CULTURE
Bilirubin Urine: NEGATIVE
CASTS: NONE SEEN
Crystals: NONE SEEN
Glucose, UA: NEGATIVE mg/dL
HGB URINE DIPSTICK: NEGATIVE
KETONES UR: NEGATIVE mg/dL
Nitrite: NEGATIVE
PH: 5.5 (ref 5.0–8.0)
PROTEIN: NEGATIVE mg/dL
Specific Gravity, Urine: 1.01 (ref 1.005–1.030)
Urobilinogen, UA: 0.2 mg/dL (ref 0.0–1.0)

## 2014-02-24 LAB — THYROID PANEL
FREE T4: 0.9 ng/dL (ref 0.80–1.80)
T3 Uptake: 27 % (ref 22.0–35.0)
T4 TOTAL: 6.5 ug/dL (ref 4.5–12.0)
TSH: 1.495 u[IU]/mL (ref 0.350–4.500)

## 2014-02-24 LAB — WET PREP FOR TRICH, YEAST, CLUE: TRICH WET PREP: NONE SEEN

## 2014-02-24 NOTE — Patient Instructions (Signed)
Use the Terazol cream for 3 days. Followup if the vaginal symptoms continue, worsen or recur. Followup with the blood test results.

## 2014-02-24 NOTE — Progress Notes (Signed)
Marie Wang 01/30/1976 [id number]        37 y.o.  G2P1011 presents complaining of intense vaginal irritation and itching. Used OTC antifungal without success. Symptoms seem to be worsening. Also 3 days late for her menses awaiting to start her Clomid. Galactorrhea seems to be getting worse. Prolactin level VII/XXIV/MM 1511. Recent spontaneous miscarriage with followed hCG's to negative 02/04/2014.   Past medical history,surgical history, problem list, medications, allergies, family history and social history were all reviewed and documented in the EPIC chart.  Directed ROS with pertinent positives and negatives documented in the history of present illness/assessment and plan.  Exam: [name]  assistant General appearance:  Normal External BUS vagina erythematous with cakey white discharge. Cervix normal. Uterus grossly normal midline mobile nontender. Adnexa without masses or tenderness.   Assessment/Plan:  38 y.o. G2P1011 late for menses, galactorrhea, recent spontaneous AB with negative hCG 02/04/2014. Check baseline thyroid panel, hCG and prolactin. Followup for results. Vaginal discharge/exam/wet prep consistent with yeast vulvovaginitis. Failed OTC products. We'll treat with Terazol 3 day cream. Reviewed category C nature of the drug in the event she is early pregnant she's comfortable taking this now.   Note: This document was prepared with digital dictation and possible smart phrase technology. Any transcriptional errors that result from this process are unintentional.   Anastasio Auerbach MD, 11:46 AM 02/24/2014

## 2014-02-25 ENCOUNTER — Other Ambulatory Visit: Payer: Self-pay | Admitting: Gynecology

## 2014-02-25 ENCOUNTER — Telehealth: Payer: Self-pay

## 2014-02-25 LAB — HCG, SERUM, QUALITATIVE: Preg, Serum: NEGATIVE

## 2014-02-25 LAB — URINE CULTURE
COLONY COUNT: NO GROWTH
ORGANISM ID, BACTERIA: NO GROWTH

## 2014-02-25 LAB — PROLACTIN: PROLACTIN: 17.8 ng/mL

## 2014-02-25 MED ORDER — MEDROXYPROGESTERONE ACETATE 10 MG PO TABS: 10.0000 mg | ORAL_TABLET | Freq: Every day | ORAL | Status: DC

## 2014-02-25 MED ORDER — TERCONAZOLE 0.8 % VA CREA: 1.0000 | TOPICAL_CREAM | Freq: Every day | VAGINAL | Status: DC

## 2014-02-25 MED ORDER — CLOMIPHENE CITRATE 50 MG PO TABS: ORAL_TABLET | ORAL | Status: DC

## 2014-02-25 NOTE — Telephone Encounter (Signed)
Patient was seen yesterday and Rx for Terazol 3 was not sent to pharmacy. I sent Rx for her.

## 2014-03-02 ENCOUNTER — Encounter: Payer: Self-pay | Admitting: Gynecology

## 2014-03-05 ENCOUNTER — Encounter: Payer: BC Managed Care – PPO | Admitting: Gynecology

## 2014-03-06 ENCOUNTER — Other Ambulatory Visit: Payer: Self-pay | Admitting: Gynecology

## 2014-03-09 ENCOUNTER — Telehealth: Payer: Self-pay

## 2014-03-09 ENCOUNTER — Other Ambulatory Visit: Payer: Self-pay | Admitting: Gynecology

## 2014-03-09 DIAGNOSIS — N912 Amenorrhea, unspecified: Secondary | ICD-10-CM

## 2014-03-09 NOTE — Telephone Encounter (Signed)
Left message to call me.

## 2014-03-09 NOTE — Telephone Encounter (Signed)
Recheck qualitative hCG here,  assuming negative then Provera 10 mg x10 days. To start Clomid on the fifth day of withdrawal bleeding as we discussed

## 2014-03-09 NOTE — Telephone Encounter (Signed)
I called patient because we received refill request for Provera today.  Dr. Loetta Rough wanted to be sure patient had checked UPT.  Patient said she did not request this Rx with pharmacy.  Patient said she is 16 days late and just keeps having brown spotting and some light cramping different from menstrual cramps. Very tearful and emotional x 2 days.  She has not checked UPT stating Dr. Loetta Rough did blood test in the office but that was 02/24/14.  What to recommend?

## 2014-03-09 NOTE — Telephone Encounter (Signed)
Left detailed message for patient at her request.  Order put in for qual pt and patient instructed to call back and schedule lab appt to come by for that.

## 2014-03-10 ENCOUNTER — Telehealth: Payer: Self-pay

## 2014-03-10 NOTE — Telephone Encounter (Signed)
error 

## 2014-03-10 NOTE — Telephone Encounter (Signed)
Fifth day of cycle to start Clomid.  Given that history it is okay to skip the hCG.

## 2014-03-10 NOTE — Telephone Encounter (Signed)
Patient said she "saw blood this morning, a decent amount" although it is off and on it looked like a period starting.  She has been feeling like it is going to start.  She said she checked a home UPT and it was negative.  She really doesn't want to have to come in for Qual PT.  She wanted to know when to start Clomid?

## 2014-03-10 NOTE — Telephone Encounter (Signed)
Patient informed. 

## 2014-04-16 ENCOUNTER — Other Ambulatory Visit: Payer: Self-pay

## 2014-05-03 ENCOUNTER — Encounter: Payer: Self-pay | Admitting: Gynecology

## 2014-07-02 NOTE — L&D Delivery Note (Signed)
Delivery Note At 12:04 PM a viable and healthy female was delivered via  (Presentation: ROA  ).  APGAR: 9, 9; weight  pending.   Placenta status: spontaneous, intact.  Cord:spontaneous  with the following complications: none.  Cord pH: na  Anesthesia: Epidural  Episiotomy:  none Lacerations:  none Suture Repair: na Est. Blood Loss (mL):  100  Mom to postpartum.  Baby to Couplet care / Skin to Skin.  Lecretia Buczek J 06/19/2015, 12:13 PM

## 2014-07-22 ENCOUNTER — Other Ambulatory Visit (INDEPENDENT_AMBULATORY_CARE_PROVIDER_SITE_OTHER): Payer: Self-pay | Admitting: Otolaryngology

## 2014-07-22 DIAGNOSIS — J0101 Acute recurrent maxillary sinusitis: Secondary | ICD-10-CM

## 2014-07-26 ENCOUNTER — Ambulatory Visit
Admission: RE | Admit: 2014-07-26 | Discharge: 2014-07-26 | Disposition: A | Discharge status: Home / Self Care | Payer: BLUE CROSS/BLUE SHIELD | Source: Ambulatory Visit | Attending: Otolaryngology | Admitting: Otolaryngology

## 2014-07-26 DIAGNOSIS — J0101 Acute recurrent maxillary sinusitis: Secondary | ICD-10-CM

## 2014-08-03 ENCOUNTER — Ambulatory Visit
Admission: RE | Admit: 2014-08-03 | Discharge: 2014-08-03 | Disposition: A | Discharge status: Home / Self Care | Payer: BLUE CROSS/BLUE SHIELD | Source: Ambulatory Visit | Attending: Family Medicine | Admitting: Family Medicine

## 2014-08-03 ENCOUNTER — Other Ambulatory Visit: Payer: Self-pay | Admitting: Family Medicine

## 2014-08-03 DIAGNOSIS — M533 Sacrococcygeal disorders, not elsewhere classified: Secondary | ICD-10-CM

## 2014-09-17 ENCOUNTER — Ambulatory Visit: Payer: Worker's Compensation

## 2014-09-17 ENCOUNTER — Ambulatory Visit (INDEPENDENT_AMBULATORY_CARE_PROVIDER_SITE_OTHER): Payer: Worker's Compensation | Admitting: Family Medicine

## 2014-09-17 VITALS — BP 128/82 | HR 88 | Temp 98.0°F | Resp 18 | Ht 65.5 in | Wt 254.6 lb

## 2014-09-17 DIAGNOSIS — M25511 Pain in right shoulder: Secondary | ICD-10-CM

## 2014-09-17 DIAGNOSIS — M545 Low back pain, unspecified: Secondary | ICD-10-CM

## 2014-09-17 DIAGNOSIS — M62830 Muscle spasm of back: Secondary | ICD-10-CM | POA: Diagnosis not present

## 2014-09-17 DIAGNOSIS — M25561 Pain in right knee: Secondary | ICD-10-CM | POA: Diagnosis not present

## 2014-09-17 DIAGNOSIS — M25551 Pain in right hip: Secondary | ICD-10-CM

## 2014-09-17 DIAGNOSIS — W19XXXA Unspecified fall, initial encounter: Secondary | ICD-10-CM

## 2014-09-17 MED ORDER — IBUPROFEN 800 MG PO TABS
800.0000 mg | ORAL_TABLET | Freq: Three times a day (TID) | ORAL | Status: DC
Start: 2014-09-17 — End: 2015-01-09

## 2014-09-17 MED ORDER — ACETAMINOPHEN 500 MG PO TABS: 1000.0000 mg | ORAL_TABLET | Freq: Three times a day (TID) | ORAL | Status: DC | PRN

## 2014-09-17 MED ORDER — TIZANIDINE HCL 4 MG PO TABS: 2.0000 mg | ORAL_TABLET | Freq: Four times a day (QID) | ORAL | Status: DC | PRN

## 2014-09-17 NOTE — Progress Notes (Signed)
09/17/2014 at 6:38 PM  Marie Wang / DOB: 05/09/1976 / MRN: [id number]  The patient has Hx gestational diabetes on her problem list.  SUBJECTIVE  Chief compalaint: Fall   History of present illness: Marie Wang is 39 y.o. 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  She reports that at 9:15 yesterday she slipped on a freshly mopped floor while approaching the mirror.  She says "my feet slipped out from under me." She states the wet floor sign was not in the placed in the area where the floor was wet, but lying up against the wall.    She reports that she landed on her right shoulder, and now has pain in her right knee, right hip and right shoulder, and back. The pain in her back radiates down her right leg. Sitting on the right side makes her pain worse.  She has tried Ibuprofen 800 mg yesterday and this morning and gave her fair relief of the pain.  She reports having some numbness in her entire right foot is going numb. She denies saddle paraesthesia and incontinence.  She denies changes in vision, head trauma, HA, nausea, chest pain, palpitations and SOB.   Allergies, medications, medical history, surgical history, and family history reviewed by me and found to be not pertinent to the encounter.   ROS  Per HPI  OBJECTIVE  Her  height is 5' 5.5" (1.664 m) and weight is 254 lb 9.6 oz (115.486 kg). Her temperature is 98 F (36.7 C). Her blood pressure is 128/82 and her pulse is 88. Her respiration is 18 and oxygen saturation is 98%.  The patient's body mass index is 41.71 kg/(m^2).  Physical Exam  Constitutional: She appears well-developed and well-nourished. No distress.  Cardiovascular: Normal rate.   Respiratory: Effort normal.  GI: Soft.  Musculoskeletal:       Right shoulder: She exhibits tenderness (Posterior, at the level of the supraspinatus) and pain. She exhibits normal range of motion, no bony tenderness, no swelling, no crepitus, no  spasm and normal strength.       Right knee: She exhibits decreased range of motion (2/2 to pain). She exhibits no swelling, no effusion, no deformity, no erythema, normal alignment, no LCL laxity, no bony tenderness and no MCL laxity. Tenderness (generalized) found.       Back:  Skin: She is not diaphoretic.    No results found for this or any previous visit (from the past 24 hour(s)).  ASSESSMENT & PLAN  Marie Wang was seen today for fall.  Diagnoses and all orders for this visit:  Fall, initial encounter: Patient here for a fall that occurred yesterday.  Radiographs reassuring from a trauma standpoint.  Her pain is likely musculoskeletal in nature.  Will treat with NSAID, APAP, and muscle relaxer.  Patient to return for f/u on the 21st. She will need a repeat knee exam at that time, as her exam was gaurded secondary to pain.   Right knee pain: Please repeat knee exam.  My exam shows her ligaments are stable, however, could not perform McMurray's due to pain.   Orders: -     Cancel: DG Knee 1-2 Views Right; Future -     ibuprofen (ADVIL,MOTRIN) 800 MG tablet; Take 1 tablet (800 mg total) by mouth every 8 (eight) hours. Take 1 tab every 8 hours -     acetaminophen (TYLENOL) 500 MG tablet; Take 2 tablets (1,000 mg total) by mouth every 8 (  eight) hours as needed. Take 2 tabs every 8 hours.  Right hip pain Orders: -     Cancel: DG Arthro Hip Right; Future -     ibuprofen (ADVIL,MOTRIN) 800 MG tablet; Take 1 tablet (800 mg total) by mouth every 8 (eight) hours. Take 1 tab every 8 hours -     acetaminophen (TYLENOL) 500 MG tablet; Take 2 tablets (1,000 mg total) by mouth every 8 (eight) hours as needed. Take 2 tabs every 8 hours. -     DG Knee 1-2 Views Right; Future -     DG HIP UNILAT WITH PELVIS 2-3 VIEWS RIGHT; Future  Right-sided low back pain without sciatica Orders: -     DG Lumbar Spine Complete; Future -     ibuprofen (ADVIL,MOTRIN) 800 MG tablet; Take 1 tablet (800 mg total) by  mouth every 8 (eight) hours. Take 1 tab every 8 hours -     acetaminophen (TYLENOL) 500 MG tablet; Take 2 tablets (1,000 mg total) by mouth every 8 (eight) hours as needed. Take 2 tabs every 8 hours. -     DG HIP UNILAT WITH PELVIS 2-3 VIEWS RIGHT; Future  Right shoulder pain Orders: -     DG Shoulder Right; Future -     ibuprofen (ADVIL,MOTRIN) 800 MG tablet; Take 1 tablet (800 mg total) by mouth every 8 (eight) hours. Take 1 tab every 8 hours -     acetaminophen (TYLENOL) 500 MG tablet; Take 2 tablets (1,000 mg total) by mouth every 8 (eight) hours as needed. Take 2 tabs every 8 hours.  Muscle spasm of back Orders: -     tiZANidine (ZANAFLEX) 4 MG tablet; Take 0.5-1 tablets (2-4 mg total) by mouth every 6 (six) hours as needed for muscle spasms.    The patient was advised to call or come back to clinic if she does not see an improvement in symptoms, or worsens with the above plan.   Philis Fendt, MHS, PA-C [facility] 09/17/2014 6:38 PM

## 2014-09-17 NOTE — Progress Notes (Deleted)
09/17/2014 at 1:07 PM  Marie Wang / DOB: 01/15/1976 / MRN: [id number]  The patient has Hx gestational diabetes on her problem list.  SUBJECTIVE  Chief compalaint: Fall   History of present illness: Marie Wang is 39 y.o. 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  She reports that at 9:15 yesterday she slipped on a wet floor while approaching the mirror.  She says "my feet slipped out from under me." She states the wet floor sign was not in the pac  She reports that she landed on her right side, and now has pain in her right knee, right hip and right shoulder, and back. Sitting on the right side makes her pain worse.  She has tried Ibuprofen 800 mg yesterday and this morning and gave her fair relief of the pain.  She reports having some numbness in her entire right foot is going numb. She denies saddle paraesthesia and incontinence.  She denies changes in vision, head trauma, HA, nausea, chest pain, palpitations and SOB.   Allergies, medications, medical history, surgical history, and family history reviewed by me and found to be not pertinent to the encounter.   ROS  OBJECTIVE  Her  height is 5' 5.5" (1.664 m) and weight is 254 lb 9.6 oz (115.486 kg). Her temperature is 98 F (36.7 C). Her blood pressure is 128/82 and her pulse is 88. Her respiration is 18 and oxygen saturation is 98%.  The patient's body mass index is 41.71 kg/(m^2).  Physical Exam  No results found for this or any previous visit (from the past 24 hour(s)).  [facility] reading (PRIMARY) by  Dr. Joseph Art: No acute bony abnormalities in the right knee, right hip, lumbar spine and right shoulder films.    ASSESSMENT & PLAN  Marie Wang was seen today for fall.  Diagnoses and all orders for this visit:  Right knee pain Orders: -     DG Knee 1-2 Views Right; Future -     ibuprofen (ADVIL,MOTRIN) 800 MG tablet; Take 1 tablet (800 mg total) by mouth every 8 (eight) hours. Take 1 tab  every 8 hours -     acetaminophen (TYLENOL) 500 MG tablet; Take 2 tablets (1,000 mg total) by mouth every 8 (eight) hours as needed. Take 2 tabs every 8 hours.  Right hip pain Orders: -     Cancel: DG Arthro Hip Right; Future -     ibuprofen (ADVIL,MOTRIN) 800 MG tablet; Take 1 tablet (800 mg total) by mouth every 8 (eight) hours. Take 1 tab every 8 hours -     acetaminophen (TYLENOL) 500 MG tablet; Take 2 tablets (1,000 mg total) by mouth every 8 (eight) hours as needed. Take 2 tabs every 8 hours. -     DG Knee 1-2 Views Right; Future  Right-sided low back pain without sciatica Orders: -     DG Lumbar Spine Complete; Future -     ibuprofen (ADVIL,MOTRIN) 800 MG tablet; Take 1 tablet (800 mg total) by mouth every 8 (eight) hours. Take 1 tab every 8 hours -     acetaminophen (TYLENOL) 500 MG tablet; Take 2 tablets (1,000 mg total) by mouth every 8 (eight) hours as needed. Take 2 tabs every 8 hours.  Right shoulder pain Orders: -     DG Shoulder Right; Future -     ibuprofen (ADVIL,MOTRIN) 800 MG tablet; Take 1 tablet (800 mg total) by mouth every 8 (eight) hours. Take 1  tab every 8 hours -     acetaminophen (TYLENOL) 500 MG tablet; Take 2 tablets (1,000 mg total) by mouth every 8 (eight) hours as needed. Take 2 tabs every 8 hours.  Muscle spasm of back Orders: -     tiZANidine (ZANAFLEX) 4 MG tablet; Take 0.5-1 tablets (2-4 mg total) by mouth every 6 (six) hours as needed for muscle spasms.   The patient was advised to call or come back to clinic if she does not see an improvement in symptoms, or worsens with the above plan.   Philis Fendt, MHS, PA-C [facility] 09/17/2014 1:07 PM

## 2014-09-20 ENCOUNTER — Ambulatory Visit (INDEPENDENT_AMBULATORY_CARE_PROVIDER_SITE_OTHER): Payer: Worker's Compensation | Admitting: Family Medicine

## 2014-09-20 VITALS — BP 126/80 | HR 69 | Temp 98.7°F | Resp 19 | Ht 65.0 in | Wt 254.6 lb

## 2014-09-20 DIAGNOSIS — S86811S Strain of other muscle(s) and tendon(s) at lower leg level, right leg, sequela: Secondary | ICD-10-CM | POA: Diagnosis not present

## 2014-09-20 DIAGNOSIS — S86911S Strain of unspecified muscle(s) and tendon(s) at lower leg level, right leg, sequela: Secondary | ICD-10-CM

## 2014-09-20 NOTE — Progress Notes (Signed)
Chief compalaint: Fall  From note of 3/18: History of present illness: Ms. Marie Wang is 39 y.o. 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 She reports that at 9:15 yesterday she slipped on a freshly mopped floor while approaching the mirror. She says "my feet slipped out from under me." She states the wet floor sign was not in the placed in the area where the floor was wet, but lying up against the wall.   She reports that she landed on her right shoulder, and now has pain in her right knee, right hip and right shoulder, and back. The pain in her back radiates down her right leg. Sitting on the right side makes her pain worse. She has tried Ibuprofen 800 mg yesterday and this morning and gave her fair relief of the pain. She reports having some numbness in her entire right foot is going numb. She denies saddle paraesthesia and incontinence. She denies changes in vision, head trauma, HA, nausea, chest pain, palpitations and SOB.   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  She now feels popping, although pain is 70% resolved.  She is taking ibuprofen and wearing her brace.  The pain is located both medial and lateral on the joint line.  Objective:  NAD Right knee:  Tender both medial and lateral joint lines without effusion.  Full ROM.  Assessment:  Knee strain with fairly rapid improvement  Plan:  Continue brace, ibuprofen, gentle ROM Restrict work activities to avoid stairs and heavy lifting Recheck in 3 more days.  Signed,  Robyn Haber, MD

## 2014-09-25 ENCOUNTER — Ambulatory Visit (INDEPENDENT_AMBULATORY_CARE_PROVIDER_SITE_OTHER): Payer: Worker's Compensation | Admitting: Family Medicine

## 2014-09-25 VITALS — BP 128/84 | HR 74 | Temp 98.3°F | Resp 16 | Ht 66.0 in | Wt 253.6 lb

## 2014-09-25 DIAGNOSIS — S86811A Strain of other muscle(s) and tendon(s) at lower leg level, right leg, initial encounter: Secondary | ICD-10-CM | POA: Diagnosis not present

## 2014-09-25 DIAGNOSIS — S86911A Strain of unspecified muscle(s) and tendon(s) at lower leg level, right leg, initial encounter: Secondary | ICD-10-CM

## 2014-09-25 NOTE — Progress Notes (Signed)
From note of 3/18: History of present illness: Ms. Ressler is 39 y.o. 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 She reports that at 9:15 yesterday she slipped on a freshly mopped floor while approaching the mirror. She says "my feet slipped out from under me." She states the wet floor sign was not in the placed in the area where the floor was wet, but lying up against the wall.   She reports that she landed on her right shoulder, and now has pain in her right knee, right hip and right shoulder, and back. The pain in her back radiates down her right leg. Sitting on the right side makes her pain worse. She has tried Ibuprofen 800 mg yesterday and this morning and gave her fair relief of the pain. She reports having some numbness in her entire right foot is going numb. She denies saddle paraesthesia and incontinence. She denies changes in vision, head trauma, HA, nausea, chest pain, palpitations and SOB.   Patient works at [employer].  Pain is easing even more.  She was off yesterday.  Her job involves quite a bit of walking.  Objective:  NAD Right knee is still mildly tender along both medial and lateral joint lines She has some mild crepitus with flexion of the knee There is no palpable effusion  Assessment:  There has been steady improvement such that I think patient may return to full duty on Monday.  If pain begins to worsen, or the joint is not entirely normal in a week, she should return.  Robyn Haber, MD

## 2014-12-02 LAB — OB RESULTS CONSOLE HIV ANTIBODY (ROUTINE TESTING): HIV: NONREACTIVE

## 2014-12-02 LAB — OB RESULTS CONSOLE RUBELLA ANTIBODY, IGM: Rubella: IMMUNE

## 2014-12-02 LAB — OB RESULTS CONSOLE ABO/RH: RH Type: POSITIVE

## 2014-12-02 LAB — OB RESULTS CONSOLE HEPATITIS B SURFACE ANTIGEN: HEP B S AG: NEGATIVE

## 2014-12-02 LAB — OB RESULTS CONSOLE RPR: RPR: NONREACTIVE

## 2014-12-02 LAB — OB RESULTS CONSOLE ANTIBODY SCREEN: ANTIBODY SCREEN: NEGATIVE

## 2014-12-16 LAB — OB RESULTS CONSOLE GC/CHLAMYDIA
CHLAMYDIA, DNA PROBE: NEGATIVE
GC PROBE AMP, GENITAL: NEGATIVE

## 2015-01-09 ENCOUNTER — Inpatient Hospital Stay (HOSPITAL_COMMUNITY)
Admission: AD | Admit: 2015-01-09 | Discharge: 2015-01-09 | Disposition: A | Discharge status: Home / Self Care | Payer: BLUE CROSS/BLUE SHIELD | Source: Ambulatory Visit | Attending: Obstetrics and Gynecology | Admitting: Obstetrics and Gynecology

## 2015-01-09 ENCOUNTER — Encounter (HOSPITAL_COMMUNITY): Payer: Self-pay | Admitting: *Deleted

## 2015-01-09 DIAGNOSIS — O9989 Other specified diseases and conditions complicating pregnancy, childbirth and the puerperium: Secondary | ICD-10-CM | POA: Diagnosis not present

## 2015-01-09 DIAGNOSIS — N898 Other specified noninflammatory disorders of vagina: Secondary | ICD-10-CM | POA: Diagnosis present

## 2015-01-09 DIAGNOSIS — Z3A14 14 weeks gestation of pregnancy: Secondary | ICD-10-CM | POA: Insufficient documentation

## 2015-01-09 DIAGNOSIS — O26892 Other specified pregnancy related conditions, second trimester: Secondary | ICD-10-CM

## 2015-01-09 LAB — URINALYSIS, ROUTINE W REFLEX MICROSCOPIC
Bilirubin Urine: NEGATIVE
Glucose, UA: NEGATIVE mg/dL
Hgb urine dipstick: NEGATIVE
Ketones, ur: NEGATIVE mg/dL
Leukocytes, UA: NEGATIVE
Nitrite: NEGATIVE
Protein, ur: NEGATIVE mg/dL
Specific Gravity, Urine: 1.01 (ref 1.005–1.030)
Urobilinogen, UA: 0.2 mg/dL (ref 0.0–1.0)
pH: 6.5 (ref 5.0–8.0)

## 2015-01-09 NOTE — Discharge Instructions (Signed)
Abdominal Pain During Pregnancy Belly (abdominal) pain is common during pregnancy. Most of the time, it is not a serious problem. Other times, it can be a sign that something is wrong with the pregnancy. Always tell your doctor if you have belly pain. HOME CARE Monitor your belly pain for any changes. The following actions may help you feel better:  Rest until your pain stops.  Drink clear fluids - lots of water to maintain hydration.  Only take medicine as told by your doctor.  Keep all doctor visits as told. GET HELP RIGHT AWAY IF:   You are bleeding, leaking fluid.  You have more pain or cramping.  You keep throwing up (vomiting).  You have pain when you pee (urinate) or have blood in your pee.  You have a fever.  You do not feel your baby moving as much.  You feel very weak or feel like passing out.  You have trouble breathing, with or without belly pain.  You have a very bad headache.  You keep having watery poop (diarrhea).  Your belly pain does not go away after resting, or the pain gets worse. MAKE SURE YOU:   Understand these instructions.  Will watch your condition.  Will get help right away if you are not doing well or get worse. Document Released: 06/06/2009 Document Revised: 02/18/2013 Document Reviewed: 01/15/2013 [facility] Patient Information 2015 [place], [place]. This information is not intended to replace advice given to you by your health care provider. Make sure you discuss any questions you have with your health care provider.

## 2015-01-09 NOTE — MAU Provider Note (Signed)
History     CSN: [id number]  Arrival date and time: 01/09/15 1734  Nurse call to provider [phone]  First Provider Initiated Contact with Patient 01/09/15 1839      Chief Complaint  Patient presents with  . Vaginal Bleeding   HPI pink spotting today with wiping intermittent cramps urinary frequency and urgency Sex last night   Past Medical History  Diagnosis Date  . Chlamydia 2009  . Hx gestational diabetes   . Anxiety   . Leiomyoma 2013    small  . Miscarriage   . Fibroid     Past Surgical History  Procedure Laterality Date  . Mouth surgery  1996    Family History  Problem Relation Age of Onset  . Hypertension Mother   . Breast cancer Paternal Aunt 36  . Ovarian cancer Maternal Grandmother   . Diabetes Paternal Grandmother   . Hypertension Father     borderline  . Diabetes Father     History  Substance Use Topics  . Smoking status: Never Smoker   . Smokeless tobacco: Never Used  . Alcohol Use: Yes     Comment: socially, not while pregnant    Allergies:  Allergies  Allergen Reactions  . Codeine Anaphylaxis and Hives  . Sulfa Antibiotics Itching  . Ciprofloxacin Rash  . Prednisone Hives, Rash and Other (See Comments)    Oral only; tolerates injection.    Prescriptions prior to admission  Medication Sig Dispense Refill Last Dose  . albuterol (PROVENTIL HFA;VENTOLIN HFA) 108 (90 BASE) MCG/ACT inhaler Inhale 2 puffs into the lungs every 6 (six) hours as needed for wheezing or shortness of breath.   rescue at rescue  . escitalopram (LEXAPRO) 20 MG tablet Take 20 mg by mouth every morning.    01/09/2015 at Unknown time  . metFORMIN (GLUCOPHAGE-XR) 500 MG 24 hr tablet Take 1 tablet by mouth daily.  1 01/09/2015 at Unknown time  . Prenatal Vit-Fe Sulfate-FA (PRENATAL VITAMIN PO) Take 1 tablet by mouth daily.    01/09/2015 at Unknown time  . acetaminophen (TYLENOL) 500 MG tablet Take 2 tablets (1,000 mg total) by mouth every 8 (eight) hours as needed. Take 2  tabs every 8 hours. (Patient not taking: Reported on 01/09/2015) 30 tablet 0 Not Taking at Unknown time  . ibuprofen (ADVIL,MOTRIN) 800 MG tablet Take 1 tablet (800 mg total) by mouth every 8 (eight) hours. Take 1 tab every 8 hours (Patient not taking: Reported on 01/09/2015) 30 tablet 0 Completed Course at Unknown time  . tiZANidine (ZANAFLEX) 4 MG tablet Take 0.5-1 tablets (2-4 mg total) by mouth every 6 (six) hours as needed for muscle spasms. (Patient not taking: Reported on 09/25/2014) 30 tablet 0 Not Taking at Unknown time    ROS Physical Exam   Blood pressure 133/67, pulse 84, temperature 98.7 F (37.1 C), temperature source Oral, resp. rate 16, height 5\' 4"  (1.626 m), weight 117.482 kg (259 lb), last menstrual period 09/30/2014.  Physical Exam Alert and oriented / NAD or pain Abdomen soft and non-tender SSE: no bleeding / scant white discharge present in vagina           cervix closed and long / non-friable VE: cervix closed and long  FHR 151  MAU Course  Procedures   Assessment and Plan  14 weeks pink discharge likely related to recent IC Urinary symptoms   1) urinalysis and urine culture 2) DC home 3) call if any red bleeding  Marie Wang 01/09/2015, 6:49 PM

## 2015-01-09 NOTE — MAU Note (Signed)
Pt presents to MAU with complaints of some vaginal spotting this morning and this evening with some mild abdominal cramping.

## 2015-01-11 LAB — CULTURE, OB URINE: Special Requests: NORMAL

## 2015-01-12 ENCOUNTER — Inpatient Hospital Stay (HOSPITAL_COMMUNITY): Payer: BLUE CROSS/BLUE SHIELD

## 2015-01-12 ENCOUNTER — Encounter (HOSPITAL_COMMUNITY): Payer: Self-pay

## 2015-01-12 ENCOUNTER — Inpatient Hospital Stay (HOSPITAL_COMMUNITY)
Admission: AD | Admit: 2015-01-12 | Discharge: 2015-01-12 | Disposition: A | Discharge status: Home / Self Care | Payer: BLUE CROSS/BLUE SHIELD | Source: Ambulatory Visit | Attending: Obstetrics and Gynecology | Admitting: Obstetrics and Gynecology

## 2015-01-12 DIAGNOSIS — O209 Hemorrhage in early pregnancy, unspecified: Secondary | ICD-10-CM | POA: Insufficient documentation

## 2015-01-12 DIAGNOSIS — D259 Leiomyoma of uterus, unspecified: Secondary | ICD-10-CM | POA: Insufficient documentation

## 2015-01-12 DIAGNOSIS — Z3A14 14 weeks gestation of pregnancy: Secondary | ICD-10-CM | POA: Diagnosis not present

## 2015-01-12 DIAGNOSIS — O09522 Supervision of elderly multigravida, second trimester: Secondary | ICD-10-CM | POA: Diagnosis not present

## 2015-01-12 DIAGNOSIS — O26852 Spotting complicating pregnancy, second trimester: Secondary | ICD-10-CM | POA: Diagnosis present

## 2015-01-12 DIAGNOSIS — O4692 Antepartum hemorrhage, unspecified, second trimester: Secondary | ICD-10-CM

## 2015-01-12 DIAGNOSIS — O3412 Maternal care for benign tumor of corpus uteri, second trimester: Secondary | ICD-10-CM | POA: Insufficient documentation

## 2015-01-12 DIAGNOSIS — O4690 Antepartum hemorrhage, unspecified, unspecified trimester: Secondary | ICD-10-CM

## 2015-01-12 HISTORY — DX: Antepartum hemorrhage, unspecified, unspecified trimester: O46.90

## 2015-01-12 LAB — CBC
HEMATOCRIT: 33.9 % — AB (ref 36.0–46.0)
Hemoglobin: 11.3 g/dL — ABNORMAL LOW (ref 12.0–15.0)
MCH: 28.7 pg (ref 26.0–34.0)
MCHC: 33.3 g/dL (ref 30.0–36.0)
MCV: 86 fL (ref 78.0–100.0)
Platelets: 268 10*3/uL (ref 150–400)
RBC: 3.94 MIL/uL (ref 3.87–5.11)
RDW: 13.1 % (ref 11.5–15.5)
WBC: 11.5 10*3/uL — ABNORMAL HIGH (ref 4.0–10.5)

## 2015-01-12 NOTE — MAU Provider Note (Signed)
History     CSN: [id number]  Arrival date and time: 01/12/15 0323  Orders placed in EPIC: [place] Provider notified: [phone] Provider at bedside: [place]        Chief Complaint  Patient presents with  . Vaginal Bleeding   HPI  Marie Wang is a 39 yo [phone] female at 14.[redacted] wks gestation presenting this morning with complaints of vaginal spotting with wiping x 2 episodes after using the BR this morning. She was recently seen in MAU for this same complaint on Sunday 7/10 after IC on Saturday 7/9 with negative findings.  She reports that the spotting was "more than it was on Sunday." She also reports abdominal cramping from increased gas that is not a new occurrence. She denies dysuria, but reports increased frequency; which is not more than she has normally had with pregnancy. Her prenatal care has been complicated by: AMA, uterine fibroids, h/o SAB, bleeding in pregnancy, and PCOS. Her primary OB provider at [facility] is Dr. Ronita Hipps. Her ABO/Rh is O Positive.  Past Medical History  Diagnosis Date  . Chlamydia 2009  . Hx gestational diabetes   . Anxiety   . Leiomyoma 2013    small  . Miscarriage   . Fibroid   . Vaginal bleeding during pregnancy, antepartum 01/12/2015    Past Surgical History  Procedure Laterality Date  . Mouth surgery  1996    Family History  Problem Relation Age of Onset  . Hypertension Mother   . Breast cancer Paternal Aunt 23  . Ovarian cancer Maternal Grandmother   . Diabetes Paternal Grandmother   . Hypertension Father     borderline  . Diabetes Father     History  Substance Use Topics  . Smoking status: Never Smoker   . Smokeless tobacco: Never Used  . Alcohol Use: Yes     Comment: socially, not while pregnant    Allergies:  Allergies  Allergen Reactions  . Codeine Anaphylaxis and Hives  . Sulfa Antibiotics Itching  . Ciprofloxacin Rash  . Prednisone Hives, Rash and Other (See Comments)    Oral only; tolerates injection.     Prescriptions prior to admission  Medication Sig Dispense Refill Last Dose  . escitalopram (LEXAPRO) 20 MG tablet Take 20 mg by mouth every morning.    01/11/2015 at Unknown time  . metFORMIN (GLUCOPHAGE-XR) 500 MG 24 hr tablet Take 1 tablet by mouth daily.  1 01/11/2015 at Unknown time  . Prenatal Vit-Fe Sulfate-FA (PRENATAL VITAMIN PO) Take 1 tablet by mouth daily.    01/11/2015 at Unknown time  . albuterol (PROVENTIL HFA;VENTOLIN HFA) 108 (90 BASE) MCG/ACT inhaler Inhale 2 puffs into the lungs every 6 (six) hours as needed for wheezing or shortness of breath.   More than a month at Unknown time    Review of Systems  Constitutional: Negative.   HENT: Negative.   Eyes: Negative.   Respiratory: Negative.   Cardiovascular: Negative.   Gastrointestinal: Positive for abdominal pain.       Cramping from gas  Genitourinary: Negative.   Musculoskeletal: Negative.   Skin: Negative.   Neurological: Negative.   Endo/Heme/Allergies: Negative.   Psychiatric/Behavioral: Negative.    Results for orders placed or performed during the hospital encounter of 01/12/15 (from the past 24 hour(s))  CBC     Status: Abnormal   Collection Time: 01/12/15  3:42 AM  Result Value Ref Range   WBC 11.5 (H) 4.0 - 10.5 K/uL   RBC 3.94 3.87 -  5.11 MIL/uL   Hemoglobin 11.3 (L) 12.0 - 15.0 g/dL   HCT 33.9 (L) 36.0 - 46.0 %   MCV 86.0 78.0 - 100.0 fL   MCH 28.7 26.0 - 34.0 pg   MCHC 33.3 30.0 - 36.0 g/dL   RDW 13.1 11.5 - 15.5 %   Platelets 268 150 - 400 K/uL   OB Limited Ultrasound (transcribed from preliminary report) Anterior placenta with NO abnormalities / cepaphalic presentation / AFI WNL / multiple fiborids / normal cervix / ovaries visualized  Physical Exam   Blood pressure 143/76, pulse 87, temperature 98.4 F (36.9 C), temperature source Oral, resp. rate 20, height 5\' 4"  (1.626 m), weight 117.754 kg (259 lb 9.6 oz), last menstrual period 09/30/2014.  Physical Exam  Constitutional: She is  oriented to person, place, and time. She appears well-developed and well-nourished.  HENT:  Head: Normocephalic and atraumatic.  Eyes: Pupils are equal, round, and reactive to light.  Neck: Normal range of motion. Neck supple.  Cardiovascular: Normal rate, regular rhythm, normal heart sounds and intact distal pulses.   Respiratory: Effort normal and breath sounds normal.  GI: Soft. Bowel sounds are normal.  Genitourinary:  SSE: small amount of light brown d/c; some friability of cervix at 5 & 6 o'clock positions; cervix visually closed/long  Musculoskeletal: Normal range of motion.  Neurological: She is alert and oriented to person, place, and time. She has normal reflexes.  Skin: Skin is warm and dry.  Psychiatric: She has a normal mood and affect. Her behavior is normal. Judgment and thought content normal.    MAU Course  Procedures CBC OB Limited Ultrasound   Assessment and Plan  39 yo G5P0131 at 14.[redacted] wks gestation Bleeding during pregnancy, second trimester Uterine Fibroids H/O SAB PCOS  Educated that small friability on cervix is cause for spotting / friability could be caused by IC, straining with BMs, vomiting or lifting something heavy / unable to perform wet prep d/t amount of blood in vaginal vault will give inconclusive results - consider having Dr. Ronita Hipps perform wet prep at [facility] Bleeding precautions discussed - call for bleeding like a period, heavy, bright red and running down legs, cramping increases in intensity Keep scheduled appt with Dr. Ronita Hipps, unless needs to be seen in the office before Call the office with any questions, problems or concerns  Graceann Congress MSN, CNM 01/12/2015, 5:01 AM

## 2015-01-12 NOTE — Discharge Instructions (Signed)
Vaginal Bleeding During Pregnancy, Second Trimester A small amount of bleeding (spotting) from the vagina is relatively common in pregnancy. It usually stops on its own. Various things can cause bleeding or spotting in pregnancy. Some bleeding may be related to the pregnancy, and some may not. Sometimes the bleeding is normal and is not a problem. However, bleeding can also be a sign of something serious. Be sure to tell your health care provider about any vaginal bleeding right away. Some possible causes of vaginal bleeding during the second trimester include:  Infection, inflammation, or growths on the cervix.   The placenta may be partially or completely covering the opening of the cervix inside the uterus (placenta previa).  The placenta may have separated from the uterus (abruption of the placenta).   You may be having early (preterm) labor.   The cervix may not be strong enough to keep a baby inside the uterus (cervical insufficiency).   Tiny cysts may have developed in the uterus instead of pregnancy tissue (molar pregnancy). HOME CARE INSTRUCTIONS  Watch your condition for any changes. The following actions may help to lessen any discomfort you are feeling:  Follow your health care provider's instructions for limiting your activity. If your health care provider orders bed rest, you may need to stay in bed and only get up to use the bathroom. However, your health care provider may allow you to continue light activity.  If needed, make plans for someone to help with your regular activities and responsibilities while you are on bed rest.  Keep track of the number of pads you use each day, how often you change pads, and how soaked (saturated) they are. Write this down.  Do not use tampons. Do not douche.  Do not have sexual intercourse or orgasms until approved by your health care provider.  If you pass any tissue from your vagina, save the tissue so you can show it to your  health care provider.  Only take over-the-counter or prescription medicines as directed by your health care provider.  Do not take aspirin because it can make you bleed.  Do not exercise or perform any strenuous activities or heavy lifting without your health care provider's permission.  Keep all follow-up appointments as directed by your health care provider. SEEK MEDICAL CARE IF:  You have any vaginal bleeding during any part of your pregnancy.  You have cramps or labor pains.  You have a fever, not controlled by medicine. SEEK IMMEDIATE MEDICAL CARE IF:   You have severe cramps in your back or belly (abdomen).  You have contractions.  You have chills.  You pass large clots or tissue from your vagina.  Your bleeding increases.  You feel light-headed or weak, or you have fainting episodes.  You are leaking fluid or have a gush of fluid from your vagina. MAKE SURE YOU:  Understand these instructions.  Will watch your condition.  Will get help right away if you are not doing well or get worse. Document Released: 03/28/2005 Document Revised: 06/23/2013 Document Reviewed: 02/23/2013 [facility] Patient Information 2015 [place], [place]. This information is not intended to replace advice given to you by your health care provider. Make sure you discuss any questions you have with your health care provider.  Pelvic Rest Pelvic rest is sometimes recommended for women when:   The placenta is partially or completely covering the opening of the cervix (placenta previa).  There is bleeding between the uterine wall and the amniotic sac in the  first trimester (subchorionic hemorrhage).  The cervix begins to open without labor starting (incompetent cervix, cervical insufficiency).  The labor is too early (preterm labor). HOME CARE INSTRUCTIONS  Do not have sexual intercourse, stimulation, or an orgasm.  Do not use tampons, douche, or put anything in the vagina.  Do not lift  anything over 10 pounds (4.5 kg).  Avoid strenuous activity or straining your pelvic muscles. SEEK MEDICAL CARE IF:  You have any vaginal bleeding during pregnancy. Treat this as a potential emergency.  You have cramping pain felt low in the stomach (stronger than menstrual cramps).  You notice vaginal discharge (watery, mucus, or bloody).  You have a low, dull backache.  There are regular contractions or uterine tightening. SEEK IMMEDIATE MEDICAL CARE IF: You have vaginal bleeding and have placenta previa.  Document Released: 10/13/2010 Document Revised: 09/10/2011 Document Reviewed: 10/13/2010 [facility] Patient Information 2015 [place], [place]. This information is not intended to replace advice given to you by your health care provider. Make sure you discuss any questions you have with your health care provider.

## 2015-01-12 NOTE — MAU Note (Signed)
Woke up at 130 am this morning with pink spotting on toilet paper.  Abdominal cramping that she thinks is gas.  Denies recent intercourse.

## 2015-03-21 ENCOUNTER — Inpatient Hospital Stay (HOSPITAL_COMMUNITY)
Admission: AD | Admit: 2015-03-21 | Discharge: 2015-03-25 | DRG: 782 | Disposition: A | Discharge status: Home / Self Care | Payer: BLUE CROSS/BLUE SHIELD | Source: Ambulatory Visit | Attending: Obstetrics and Gynecology | Admitting: Obstetrics and Gynecology

## 2015-03-21 ENCOUNTER — Inpatient Hospital Stay (HOSPITAL_COMMUNITY): Payer: BLUE CROSS/BLUE SHIELD

## 2015-03-21 ENCOUNTER — Encounter (HOSPITAL_COMMUNITY): Payer: Self-pay

## 2015-03-21 DIAGNOSIS — O09212 Supervision of pregnancy with history of pre-term labor, second trimester: Secondary | ICD-10-CM

## 2015-03-21 DIAGNOSIS — O09892 Supervision of other high risk pregnancies, second trimester: Secondary | ICD-10-CM

## 2015-03-21 DIAGNOSIS — O3432 Maternal care for cervical incompetence, second trimester: Principal | ICD-10-CM

## 2015-03-21 DIAGNOSIS — O3412 Maternal care for benign tumor of corpus uteri, second trimester: Secondary | ICD-10-CM

## 2015-03-21 DIAGNOSIS — O321XX0 Maternal care for breech presentation, not applicable or unspecified: Secondary | ICD-10-CM | POA: Diagnosis present

## 2015-03-21 DIAGNOSIS — O4700 False labor before 37 completed weeks of gestation, unspecified trimester: Secondary | ICD-10-CM

## 2015-03-21 DIAGNOSIS — D259 Leiomyoma of uterus, unspecified: Secondary | ICD-10-CM | POA: Diagnosis present

## 2015-03-21 DIAGNOSIS — Z8249 Family history of ischemic heart disease and other diseases of the circulatory system: Secondary | ICD-10-CM

## 2015-03-21 DIAGNOSIS — Z833 Family history of diabetes mellitus: Secondary | ICD-10-CM

## 2015-03-21 DIAGNOSIS — O09522 Supervision of elderly multigravida, second trimester: Secondary | ICD-10-CM

## 2015-03-21 DIAGNOSIS — O479 False labor, unspecified: Secondary | ICD-10-CM

## 2015-03-21 DIAGNOSIS — Z3A24 24 weeks gestation of pregnancy: Secondary | ICD-10-CM

## 2015-03-21 HISTORY — DX: Maternal care for cervical incompetence, second trimester: O34.32

## 2015-03-22 ENCOUNTER — Encounter (HOSPITAL_COMMUNITY): Payer: Self-pay | Admitting: Obstetrics and Gynecology

## 2015-03-22 DIAGNOSIS — R109 Unspecified abdominal pain: Secondary | ICD-10-CM | POA: Diagnosis present

## 2015-03-22 DIAGNOSIS — O321XX0 Maternal care for breech presentation, not applicable or unspecified: Secondary | ICD-10-CM | POA: Diagnosis present

## 2015-03-22 DIAGNOSIS — Z8249 Family history of ischemic heart disease and other diseases of the circulatory system: Secondary | ICD-10-CM | POA: Diagnosis not present

## 2015-03-22 DIAGNOSIS — O09522 Supervision of elderly multigravida, second trimester: Secondary | ICD-10-CM | POA: Diagnosis not present

## 2015-03-22 DIAGNOSIS — D259 Leiomyoma of uterus, unspecified: Secondary | ICD-10-CM | POA: Diagnosis present

## 2015-03-22 DIAGNOSIS — O3412 Maternal care for benign tumor of corpus uteri, second trimester: Secondary | ICD-10-CM | POA: Diagnosis present

## 2015-03-22 DIAGNOSIS — Z833 Family history of diabetes mellitus: Secondary | ICD-10-CM | POA: Diagnosis not present

## 2015-03-22 DIAGNOSIS — O3432 Maternal care for cervical incompetence, second trimester: Secondary | ICD-10-CM

## 2015-03-22 DIAGNOSIS — Z3A24 24 weeks gestation of pregnancy: Secondary | ICD-10-CM | POA: Diagnosis present

## 2015-03-22 HISTORY — DX: Maternal care for cervical incompetence, second trimester: O34.32

## 2015-03-22 LAB — CBC WITH DIFFERENTIAL/PLATELET
BASOS ABS: 0 10*3/uL (ref 0.0–0.1)
Basophils Relative: 0 %
Eosinophils Absolute: 0 10*3/uL (ref 0.0–0.7)
Eosinophils Relative: 0 %
HEMATOCRIT: 33.2 % — AB (ref 36.0–46.0)
HEMOGLOBIN: 10.9 g/dL — AB (ref 12.0–15.0)
LYMPHS PCT: 13 %
Lymphs Abs: 1.6 10*3/uL (ref 0.7–4.0)
MCH: 28.3 pg (ref 26.0–34.0)
MCHC: 32.8 g/dL (ref 30.0–36.0)
MCV: 86.2 fL (ref 78.0–100.0)
MONO ABS: 0.2 10*3/uL (ref 0.1–1.0)
Monocytes Relative: 2 %
NEUTROS ABS: 10.8 10*3/uL — AB (ref 1.7–7.7)
NEUTROS PCT: 85 %
Platelets: 256 10*3/uL (ref 150–400)
RBC: 3.85 MIL/uL — AB (ref 3.87–5.11)
RDW: 13.6 % (ref 11.5–15.5)
WBC: 12.6 10*3/uL — AB (ref 4.0–10.5)

## 2015-03-22 LAB — GROUP B STREP BY PCR: GROUP B STREP BY PCR: NEGATIVE

## 2015-03-22 LAB — URINALYSIS W MICROSCOPIC (NOT AT ARMC)
Bilirubin Urine: NEGATIVE
GLUCOSE, UA: NEGATIVE mg/dL
HGB URINE DIPSTICK: NEGATIVE
KETONES UR: NEGATIVE mg/dL
Leukocytes, UA: NEGATIVE
Nitrite: NEGATIVE
PH: 6.5 (ref 5.0–8.0)
Protein, ur: NEGATIVE mg/dL
Specific Gravity, Urine: <1.005 — ABNORMAL LOW (ref 1.005–1.030)
UROBILINOGEN UA: 0.2 mg/dL (ref 0.0–1.0)

## 2015-03-22 LAB — ABO/RH: ABO/RH(D): O POS

## 2015-03-22 LAB — TYPE AND SCREEN
ABO/RH(D): O POS
Antibody Screen: NEGATIVE

## 2015-03-22 MED ORDER — DOCUSATE SODIUM 100 MG PO CAPS
100.0000 mg | ORAL_CAPSULE | Freq: Every day | ORAL | Status: DC
  Administered 2015-03-23 – 2015-03-24 (×2): 100 mg via ORAL
  Filled 2015-03-22 (×3): qty 1

## 2015-03-22 MED ORDER — TERBUTALINE SULFATE 1 MG/ML IJ SOLN
0.2500 mg | Freq: Once | INTRAMUSCULAR | Status: AC
  Administered 2015-03-22: 0.25 mg via SUBCUTANEOUS
  Filled 2015-03-22: qty 1

## 2015-03-22 MED ORDER — PROGESTERONE MICRONIZED 200 MG PO CAPS
200.0000 mg | ORAL_CAPSULE | Freq: Every day | ORAL | Status: DC
  Administered 2015-03-22 – 2015-03-24 (×4): 200 mg via VAGINAL
  Filled 2015-03-22 (×4): qty 1

## 2015-03-22 MED ORDER — SODIUM CHLORIDE 0.9 % IV SOLN
2.0000 g | Freq: Four times a day (QID) | INTRAVENOUS | Status: DC
  Administered 2015-03-22: 2 g via INTRAVENOUS
  Filled 2015-03-22 (×3): qty 2000

## 2015-03-22 MED ORDER — ZOLPIDEM TARTRATE 5 MG PO TABS: 5.0000 mg | ORAL_TABLET | Freq: Every evening | ORAL | Status: DC | PRN

## 2015-03-22 MED ORDER — INDOMETHACIN 25 MG PO CAPS
25.0000 mg | ORAL_CAPSULE | Freq: Four times a day (QID) | ORAL | Status: DC
  Administered 2015-03-22 – 2015-03-23 (×7): 25 mg via ORAL
  Filled 2015-03-22 (×10): qty 1

## 2015-03-22 MED ORDER — METFORMIN HCL ER 500 MG PO TB24
500.0000 mg | ORAL_TABLET | Freq: Every day | ORAL | Status: DC
  Administered 2015-03-22 – 2015-03-23 (×2): 500 mg via ORAL
  Filled 2015-03-22 (×3): qty 1

## 2015-03-22 MED ORDER — INDOMETHACIN 50 MG PO CAPS
50.0000 mg | ORAL_CAPSULE | Freq: Once | ORAL | Status: AC
  Administered 2015-03-22: 50 mg via ORAL
  Filled 2015-03-22: qty 1

## 2015-03-22 MED ORDER — ACETAMINOPHEN 325 MG PO TABS: 650.0000 mg | ORAL_TABLET | ORAL | Status: DC | PRN

## 2015-03-22 MED ORDER — PRENATAL MULTIVITAMIN CH
1.0000 | ORAL_TABLET | Freq: Every day | ORAL | Status: DC
  Administered 2015-03-22 – 2015-03-24 (×3): 1 via ORAL
  Filled 2015-03-22 (×4): qty 1

## 2015-03-22 MED ORDER — BETAMETHASONE SOD PHOS & ACET 6 (3-3) MG/ML IJ SUSP
12.0000 mg | INTRAMUSCULAR | Status: AC
  Administered 2015-03-22 – 2015-03-23 (×2): 12 mg via INTRAMUSCULAR
  Filled 2015-03-22 (×2): qty 2

## 2015-03-22 MED ORDER — LACTATED RINGERS IV SOLN
INTRAVENOUS | Status: DC
  Administered 2015-03-22: 08:00:00 via INTRAVENOUS

## 2015-03-22 MED ORDER — CALCIUM CARBONATE ANTACID 500 MG PO CHEW
2.0000 | CHEWABLE_TABLET | ORAL | Status: DC | PRN
  Filled 2015-03-22: qty 2

## 2015-03-22 MED ORDER — ESCITALOPRAM OXALATE 20 MG PO TABS
20.0000 mg | ORAL_TABLET | Freq: Every day | ORAL | Status: DC
  Administered 2015-03-22 – 2015-03-24 (×3): 20 mg via ORAL
  Filled 2015-03-22 (×3): qty 1

## 2015-03-22 NOTE — MAU Provider Note (Signed)
History & Physical      CSN: [id number]  Arrival date and time: 03/21/15 2244  Orders placed in EPIC: [id number] Provider notified: 2310 Provider at bedside: [place]       Chief Complaint  Patient presents with  . Abdominal Pain   HPI  Ms. Marie Wang is a 39 yo [phone] female at 24.[redacted] wks gestation by LMP, presenting tonight with complaints of pelvic pressure and pains that radiate from lower back to lower abdomen since Sunday night.  She thought the pain was from gas, but has had no relief with OTC gas medications, rest, walking, or increased hydration. She is seen weekly in the office for a recent dx of shortening cervix. Her last CL was 2.1 cm.  She takes Prometrium 200 mg vaginally at bedtime, but last took it Saturday night.  Denies VB or LOF.  Reports some DFM.  Her prenatal care has been complicated by cervical insufficiency, AMA, uterine fibroids, h/o SAB, bleeding in pregnancy, and PCOS Her primary OB provider at [facility] is Dr. Ronita Hipps.  Past Medical History  Diagnosis Date  . Chlamydia 2009  . Hx gestational diabetes   . Anxiety   . Leiomyoma 2013    small  . Miscarriage   . Fibroid   . Vaginal bleeding during pregnancy, antepartum 01/12/2015  . Cervical insufficiency during pregnancy in second trimester, antepartum 03/22/2015    Past Surgical History  Procedure Laterality Date  . Mouth surgery  1996    Family History  Problem Relation Age of Onset  . Hypertension Mother   . Breast cancer Paternal Aunt 74  . Ovarian cancer Maternal Grandmother   . Diabetes Paternal Grandmother   . Hypertension Father     borderline  . Diabetes Father     Social History  Substance Use Topics  . Smoking status: Never Smoker   . Smokeless tobacco: Never Used  . Alcohol Use: Yes     Comment: socially, not while pregnant    Allergies:  Allergies  Allergen Reactions  . Codeine Anaphylaxis and Hives  . Sulfa Antibiotics Itching  . Ciprofloxacin Rash  . Prednisone Hives, Rash and  Other (See Comments)    Oral only; tolerates injection.    Prescriptions prior to admission  Medication Sig Dispense Refill Last Dose  . albuterol (PROVENTIL HFA;VENTOLIN HFA) 108 (90 BASE) MCG/ACT inhaler Inhale 2 puffs into the lungs every 6 (six) hours as needed for wheezing or shortness of breath.   More than a month at Unknown time  . escitalopram (LEXAPRO) 20 MG tablet Take 20 mg by mouth every morning.    01/11/2015 at Unknown time  . metFORMIN (GLUCOPHAGE-XR) 500 MG 24 hr tablet Take 1 tablet by mouth daily.  1 01/11/2015 at Unknown time  . Prenatal Vit-Fe Sulfate-FA (PRENATAL VITAMIN PO) Take 1 tablet by mouth daily.    01/11/2015 at Unknown time    Review of Systems  Constitutional: Negative.   HENT: Negative.   Eyes: Negative.   Respiratory: Negative.   Cardiovascular: Negative.   Gastrointestinal: Positive for abdominal pain.  Genitourinary:       Pelvic pressure; ? UC's since last night  Musculoskeletal: Negative.   Skin: Negative.   Neurological: Negative.   Endo/Heme/Allergies: Negative.   Psychiatric/Behavioral: Negative.      Results for orders placed or performed during the hospital encounter of 03/21/15 (from the past 24 hour(s))  Urinalysis with microscopic     Status: Abnormal   Collection Time: 03/21/15 11:51 PM  Result Value Ref Range   Color, Urine YELLOW YELLOW   APPearance CLEAR CLEAR   Specific Gravity, Urine <1.005 (L) 1.005 - 1.030   pH 6.5 5.0 - 8.0   Glucose, UA NEGATIVE NEGATIVE mg/dL   Hgb urine dipstick NEGATIVE NEGATIVE   Bilirubin Urine NEGATIVE NEGATIVE   Ketones, ur NEGATIVE NEGATIVE mg/dL   Protein, ur NEGATIVE NEGATIVE mg/dL   Urobilinogen, UA 0.2 0.0 - 1.0 mg/dL   Nitrite NEGATIVE NEGATIVE   Leukocytes, UA NEGATIVE NEGATIVE   Bacteria, UA RARE RARE   Squamous Epithelial / LPF RARE RARE   OB limited U/S CL 1.75 cm / Funnel length 3.37 cm / Funnel Width 2.32 / breech presentation / AFI WNL / multiple fibroids   CEFM FHR: 140 bpm /  moderate variability / accels present / variable decels Toco: regular every 2-5 mins  Physical Exam   Blood pressure 123/70, pulse 99, temperature 98.6 F (37 C), temperature source Oral, resp. rate 18, last menstrual period 09/30/2014, SpO2 99 %.  Physical Exam  Constitutional: She is oriented to person, place, and time. She appears well-developed and well-nourished.  HENT:  Head: Normocephalic and atraumatic.  Eyes: Pupils are equal, round, and reactive to light.  Neck: Normal range of motion.  Cardiovascular: Normal rate, regular rhythm, normal heart sounds and intact distal pulses.   Respiratory: Effort normal and breath sounds normal.  GI: Soft. Bowel sounds are normal.  Genitourinary:  VE: FT/80%/undetermined - breech by sono  Musculoskeletal: Normal range of motion.  Neurological: She is alert and oriented to person, place, and time. She has normal reflexes.  Skin: Skin is warm and dry.  Psychiatric: She has a normal mood and affect. Her behavior is normal. Judgment and thought content normal.    MAU Course  Procedures CEFM CCUA OB Limited U/S - check cervical length  Terbutaline 0.25 mg SQ Assessment and Plan  39 yo G5P0131 at 24.[redacted] wks gestation Preterm Labor Cervical Insufficiency Uterine Fibroids H/O PTB  Admit to Antenatal Unit BMZ x 2 doses GBS Indocin 50 mg loading dose, then 25 mg every 6 hours Prometrium 200 mg pv hs Lactated Ringers infusion at 125 ml/hr Ampicillin 2 grams every 6 hrs IVPB Neonatologist Consult later 9/20 Routine Antenatal orders  *Dr. Ronita Hipps notified of assessment and results - consult with Dr. Ronita Hipps for admission orders  Graceann Congress MSN, CNM 03/22/2015, 12:22 AM

## 2015-03-22 NOTE — H&P (Signed)
History & Physical      CSN: [id number]  Arrival date and time: 03/21/15 2244  Orders placed in EPIC: 2258 Provider notified: 2310 Provider at bedside: [place]       Chief Complaint  Patient presents with  . Abdominal Pain   HPI  Marie Wang is a 39 yo [phone] female at 24.[redacted] wks gestation by LMP, presenting tonight with complaints of pelvic pressure and pains that radiate from lower back to lower abdomen since Sunday night.  She thought the pain was from gas, but has had no relief with OTC gas medications, rest, walking, or increased hydration. She is seen weekly in the office for a recent dx of shortening cervix. Her last CL was 2.1 cm.  She takes Prometrium 200 mg vaginally at bedtime, but last took it Saturday night.  Denies VB or LOF.  Reports some DFM.  Her prenatal care has been complicated by cervical insufficiency, AMA, uterine fibroids, h/o SAB, bleeding in pregnancy, and PCOS Her primary OB provider at [facility] is Dr. Ronita Hipps.  Past Medical History  Diagnosis Date  . Chlamydia 2009  . Hx gestational diabetes   . Anxiety   . Leiomyoma 2013    small  . Miscarriage   . Fibroid   . Vaginal bleeding during pregnancy, antepartum 01/12/2015  . Cervical insufficiency during pregnancy in second trimester, antepartum 03/22/2015    Past Surgical History  Procedure Laterality Date  . Mouth surgery  1996    Family History  Problem Relation Age of Onset  . Hypertension Mother   . Breast cancer Paternal Aunt 84  . Ovarian cancer Maternal Grandmother   . Diabetes Paternal Grandmother   . Hypertension Father     borderline  . Diabetes Father     Social History  Substance Use Topics  . Smoking status: Never Smoker   . Smokeless tobacco: Never Used  . Alcohol Use: Yes     Comment: socially, not while pregnant    Allergies:  Allergies  Allergen Reactions  . Codeine Anaphylaxis and Hives  . Sulfa Antibiotics Itching  . Ciprofloxacin Rash  . Prednisone Hives, Rash and  Other (See Comments)    Oral only; tolerates injection.    Prescriptions prior to admission  Medication Sig Dispense Refill Last Dose  . albuterol (PROVENTIL HFA;VENTOLIN HFA) 108 (90 BASE) MCG/ACT inhaler Inhale 2 puffs into the lungs every 6 (six) hours as needed for wheezing or shortness of breath.   More than a month at Unknown time  . escitalopram (LEXAPRO) 20 MG tablet Take 20 mg by mouth every morning.    01/11/2015 at Unknown time  . metFORMIN (GLUCOPHAGE-XR) 500 MG 24 hr tablet Take 1 tablet by mouth daily.  1 01/11/2015 at Unknown time  . Prenatal Vit-Fe Sulfate-FA (PRENATAL VITAMIN PO) Take 1 tablet by mouth daily.    01/11/2015 at Unknown time    Review of Systems  Constitutional: Negative.   HENT: Negative.   Eyes: Negative.   Respiratory: Negative.   Cardiovascular: Negative.   Gastrointestinal: Positive for abdominal pain.  Genitourinary:       Pelvic pressure; ? UC's since last night  Musculoskeletal: Negative.   Skin: Negative.   Neurological: Negative.   Endo/Heme/Allergies: Negative.   Psychiatric/Behavioral: Negative.      Results for orders placed or performed during the hospital encounter of 03/21/15 (from the past 24 hour(s))  Urinalysis with microscopic     Status: Abnormal   Collection Time: 03/21/15 11:51 PM  Result Value Ref Range   Color, Urine YELLOW YELLOW   APPearance CLEAR CLEAR   Specific Gravity, Urine <1.005 (L) 1.005 - 1.030   pH 6.5 5.0 - 8.0   Glucose, UA NEGATIVE NEGATIVE mg/dL   Hgb urine dipstick NEGATIVE NEGATIVE   Bilirubin Urine NEGATIVE NEGATIVE   Ketones, ur NEGATIVE NEGATIVE mg/dL   Protein, ur NEGATIVE NEGATIVE mg/dL   Urobilinogen, UA 0.2 0.0 - 1.0 mg/dL   Nitrite NEGATIVE NEGATIVE   Leukocytes, UA NEGATIVE NEGATIVE   Bacteria, UA RARE RARE   Squamous Epithelial / LPF RARE RARE   OB limited U/S CL 1.75 cm / Funnel length 3.37 cm / Funnel Width 2.32 / breech presentation / AFI WNL / multiple fibroids   CEFM FHR: 120-140  bpm / moderate variability / accels present  Toco: regular every 2-5 mins  Physical Exam   Blood pressure 123/70, pulse 99, temperature 98.6 F (37 C), temperature source Oral, resp. rate 18, last menstrual period 09/30/2014, SpO2 99 %.  Physical Exam  Constitutional: She is oriented to person, place, and time. She appears well-developed and well-nourished.  HENT:  Head: Normocephalic and atraumatic.  Eyes: Pupils are equal, round, and reactive to light.  Neck: Normal range of motion.  Cardiovascular: Normal rate, regular rhythm, normal heart sounds and intact distal pulses.   Respiratory: Effort normal and breath sounds normal.  GI: Soft. Bowel sounds are normal.  Genitourinary:  VE: FT/80%/undetermined - breech by sono  Musculoskeletal: Normal range of motion.  Neurological: She is alert and oriented to person, place, and time. She has normal reflexes.  Skin: Skin is warm and dry.  Psychiatric: She has a normal mood and affect. Her behavior is normal. Judgment and thought content normal.    MAU Course  Procedures CEFM CCUA OB Limited U/S - check cervical length  Terbutaline 0.25 mg SQ Assessment and Plan  39 yo G5P0131 at 24.[redacted] wks gestation Preterm Labor Cervical Insufficiency Uterine Fibroids H/O indicated PTB  Admit  BMZ series GBS done Indocin 50 mg loading dose, then 25 mg every 6 hours Prometrium 200 mg pv hs Lactated Ringers infusion at 125 ml/hr Ampicillin 2 grams every 6 hrs IVPB until GBS resulted Neonatologist Consult later 9/20 Routine Antenatal orders

## 2015-03-22 NOTE — MAU Note (Signed)
Report called to [facility] on BS. Will go to 160

## 2015-03-22 NOTE — Progress Notes (Signed)
Patient ID: Marie Wang, female   DOB: [birth year], 39 y.o.   MRN: [id number] HD#0 24 5/7 weeks PTL vs Cervical insufficiency  S: No complaints Contractions resolved Good FM No bleeding or LOF  O: BP 120/54 mmHg  Pulse 109  Temp(Src) 98.4 F (36.9 C) (Oral)  Resp 18  SpO2 99%  LMP 09/30/2014  NCAT Neck: supple with FROM Lungs: CTA CV: RRR Abd: gravid , NT No CVAT VE: cl/50/OOP Ext: no cords Neuro: nonfocal Skin : intact  GBS neg cbc pending FHR 120-140 , BTBV 5-25, accels, no decels No contractions noted  A: 24 5/7 weeks PTL vs cervical insufficiency History of indicated PTB Multiple fibroids Breech  P: Indocin x 48-72hrs BMZ NST q shift Vaginal prometrium DC Ampicillin FFN in am Rpt CL in 48-72 hrs

## 2015-03-23 LAB — FETAL FIBRONECTIN: FETAL FIBRONECTIN: NEGATIVE

## 2015-03-23 NOTE — Progress Notes (Signed)
Patient ID: Marie Wang, female   DOB: 01-28-1976, 39 y.o.   MRN: [id number] HD#1 24 6/7 weeks PTL vs Cervical insufficiency  S: No complaints Contractions resolved Good FM No bleeding or LOF  O: BP 119/68 mmHg  Pulse 90  Temp(Src) 97.4 F (36.3 C) (Axillary)  Resp 18  SpO2 99%  LMP 09/30/2014  NCAT Neck: supple with FROM Lungs: CTA CV: RRR Abd: gravid , NT No CVAT VE: cl/50/OOP Ext: no cords Neuro: nonfocal Skin : intact  CBC    Component Value Date/Time   WBC 12.6* 03/22/2015 0812   RBC 3.85* 03/22/2015 0812   HGB 10.9* 03/22/2015 0812   HCT 33.2* 03/22/2015 0812   PLT 256 03/22/2015 0812   MCV 86.2 03/22/2015 0812   MCH 28.3 03/22/2015 0812   MCHC 32.8 03/22/2015 0812   RDW 13.6 03/22/2015 0812   LYMPHSABS 1.6 03/22/2015 0812   MONOABS 0.2 03/22/2015 0812   EOSABS 0.0 03/22/2015 0812   BASOSABS 0.0 03/22/2015 0812    GBS neg FHR 120-140 , BTBV 5-25, accels, no decels No contractions noted  A: 24 6/7 weeks PTL vs cervical insufficiency on Indocin History of indicated PTB Multiple fibroids Breech  P: Indocin x 48hrs BMZ complete NST q shift Vaginal prometrium qhs FFN today Rpt CL tomorrow

## 2015-03-24 ENCOUNTER — Inpatient Hospital Stay (HOSPITAL_COMMUNITY): Payer: BLUE CROSS/BLUE SHIELD

## 2015-03-24 NOTE — Progress Notes (Signed)
Patient ID: Marie Wang, female   DOB: 1975-12-26, 39 y.o.   MRN: [id number] HD#3 25 0/7 weeks PTL vs Cervical insufficiency  S: No complaints Contractions resolved Good FM No bleeding or LOF  O: BP 112/58 mmHg  Pulse 86  Temp(Src) 98.7 F (37.1 C) (Oral)  Resp 20  Wt 118.752 kg (261 lb 12.8 oz)  SpO2 99%  LMP 09/30/2014  NCAT Neck: supple with FROM Lungs: CTA CV: RRR Abd: gravid , NT No CVAT VE: cl/50/OOP Ext: no cords Neuro: nonfocal Skin : intact  CBC    Component Value Date/Time   WBC 12.6* 03/22/2015 0812   RBC 3.85* 03/22/2015 0812   HGB 10.9* 03/22/2015 0812   HCT 33.2* 03/22/2015 0812   PLT 256 03/22/2015 0812   MCV 86.2 03/22/2015 0812   MCH 28.3 03/22/2015 0812   MCHC 32.8 03/22/2015 0812   RDW 13.6 03/22/2015 0812   LYMPHSABS 1.6 03/22/2015 0812   MONOABS 0.2 03/22/2015 0812   EOSABS 0.0 03/22/2015 0812   BASOSABS 0.0 03/22/2015 0812    GBS neg FFN neg FHR 140-155 , BTBV 5-25, accels, rare mild variable decels No contractions noted  A: 25 0/7 weeks PTL vs cervical insufficiency stable. FFN neg History of indicated PTB Multiple fibroids Breech  P: Indocin dc'ed BMZ complete NST q shift Vaginal prometrium qhs Rpt CL today Possible DC today or tomorrow

## 2015-03-25 ENCOUNTER — Other Ambulatory Visit: Payer: Self-pay | Admitting: Obstetrics and Gynecology

## 2015-03-25 MED ORDER — NIFEDIPINE 10 MG PO CAPS: 10.0000 mg | ORAL_CAPSULE | Freq: Three times a day (TID) | ORAL | Status: DC

## 2015-03-25 NOTE — Progress Notes (Signed)
Patient ID: Marie Wang, female   DOB: Aug 03, 1975, 39 y.o.   MRN: [id number] HD#4 25 1/7 weeks PTL vs Cervical insufficiency  S: No complaints Contractions resolved Good FM No bleeding or LOF  O: BP 112/59 mmHg  Pulse 82  Temp(Src) 98.9 F (37.2 C) (Oral)  Resp 20  Wt 118.752 kg (261 lb 12.8 oz)  SpO2 99%  LMP 09/30/2014  NCAT Neck: supple with FROM Lungs: CTA CV: RRR Abd: gravid , NT No CVAT VE: cl/50/OOP Ext: no cords Neuro: nonfocal Skin : intact  CBC    Component Value Date/Time   WBC 12.6* 03/22/2015 0812   RBC 3.85* 03/22/2015 0812   HGB 10.9* 03/22/2015 0812   HCT 33.2* 03/22/2015 0812   PLT 256 03/22/2015 0812   MCV 86.2 03/22/2015 0812   MCH 28.3 03/22/2015 0812   MCHC 32.8 03/22/2015 0812   RDW 13.6 03/22/2015 0812   LYMPHSABS 1.6 03/22/2015 0812   MONOABS 0.2 03/22/2015 0812   EOSABS 0.0 03/22/2015 0812   BASOSABS 0.0 03/22/2015 0812    GBS neg FFN neg FHR 140-155 , BTBV 5-25, accels, rare mild variable decels No contractions noted CL stable 3.2cm  A: 25 1/7 weeks PTL vs cervical insufficiency stable. FFN neg. CL stable History of indicated PTB Multiple fibroids Breech  P: Indocin dc'ed BMZ complete NST q shift Vaginal prometrium qhs Rpt CL today DC today o

## 2015-03-25 NOTE — Discharge Summary (Signed)
Marie Wang, MIRABAL NO.:  [id number]  MEDICAL RECORD NO.:  [id number]  LOCATION:  2395                          FACILITY:  [facility]  PHYSICIAN:  Lovenia [name], M.D.DATE OF BIRTH:  [birth year]  DATE OF ADMISSION:  03/21/2015 DATE OF DISCHARGE:  03/25/2015                              DISCHARGE SUMMARY   The patient admitted with cervical insufficiency/preterm labor, March 22, 2015.  Underwent betamethasone course, Indocin, terbutaline.  Remained stable. Tocolysis without difficulty.  Followup cervical length was within normal limits.  She was discharged to home on hospital day #4.  Discharge teaching done.  Discharge medications to include p.o. Procardia to take p.r.n. as instructed and vaginal Prometrium at bedtime.  She is to follow up in the office within 1 week.     Lovenia [name], M.D.     RJT/MEDQ  D:  03/25/2015  T:  03/25/2015  Job:  [id number]

## 2015-06-07 LAB — OB RESULTS CONSOLE GBS: STREP GROUP B AG: NEGATIVE

## 2015-06-11 ENCOUNTER — Encounter (HOSPITAL_COMMUNITY): Payer: Self-pay | Admitting: *Deleted

## 2015-06-11 ENCOUNTER — Inpatient Hospital Stay (HOSPITAL_COMMUNITY)
Admission: AD | Admit: 2015-06-11 | Discharge: 2015-06-11 | Disposition: A | Discharge status: Home / Self Care | Payer: BLUE CROSS/BLUE SHIELD | Source: Ambulatory Visit | Attending: Obstetrics and Gynecology | Admitting: Obstetrics and Gynecology

## 2015-06-11 DIAGNOSIS — Z3493 Encounter for supervision of normal pregnancy, unspecified, third trimester: Secondary | ICD-10-CM | POA: Diagnosis present

## 2015-06-11 DIAGNOSIS — O4690 Antepartum hemorrhage, unspecified, unspecified trimester: Secondary | ICD-10-CM

## 2015-06-11 DIAGNOSIS — O3432 Maternal care for cervical incompetence, second trimester: Secondary | ICD-10-CM

## 2015-06-11 LAB — URINALYSIS, ROUTINE W REFLEX MICROSCOPIC
Bilirubin Urine: NEGATIVE
Glucose, UA: NEGATIVE mg/dL
Ketones, ur: 15 mg/dL — AB
Leukocytes, UA: NEGATIVE
Nitrite: NEGATIVE
Protein, ur: NEGATIVE mg/dL
Specific Gravity, Urine: 1.02 (ref 1.005–1.030)
pH: 6 (ref 5.0–8.0)

## 2015-06-11 LAB — URINE MICROSCOPIC-ADD ON

## 2015-06-11 NOTE — Discharge Instructions (Signed)
Third Trimester of Pregnancy °The third trimester is from week 29 through week 42, months 7 through 9. The third trimester is a time when the fetus is growing rapidly. At the end of the ninth month, the fetus is about 20 inches in length and weighs 6-10 pounds.  °BODY CHANGES °Your body goes through many changes during pregnancy. The changes vary from woman to woman.  °· Your weight will continue to increase. You can expect to gain 25-35 pounds (11-16 kg) by the end of the pregnancy. °· You may begin to get stretch marks on your hips, abdomen, and breasts. °· You may urinate more often because the fetus is moving lower into your pelvis and pressing on your bladder. °· You may develop or continue to have heartburn as a result of your pregnancy. °· You may develop constipation because certain hormones are causing the muscles that push waste through your intestines to slow down. °· You may develop hemorrhoids or swollen, bulging veins (varicose veins). °· You may have pelvic pain because of the weight gain and pregnancy hormones relaxing your joints between the bones in your pelvis. Backaches may result from overexertion of the muscles supporting your posture. °· You may have changes in your hair. These can include thickening of your hair, rapid growth, and changes in texture. Some women also have hair loss during or after pregnancy, or hair that feels dry or thin. Your hair will most likely return to normal after your baby is born. °· Your breasts will continue to grow and be tender. A yellow discharge may leak from your breasts called colostrum. °· Your belly button may stick out. °· You may feel short of breath because of your expanding uterus. °· You may notice the fetus "dropping," or moving lower in your abdomen. °· You may have a bloody mucus discharge. This usually occurs a few days to a week before labor begins. °· Your cervix becomes thin and soft (effaced) near your due date. °WHAT TO EXPECT AT YOUR PRENATAL  EXAMS  °You will have prenatal exams every 2 weeks until week 36. Then, you will have weekly prenatal exams. During a routine prenatal visit: °· You will be weighed to make sure you and the fetus are growing normally. °· Your blood pressure is taken. °· Your abdomen will be measured to track your baby's growth. °· The fetal heartbeat will be listened to. °· Any test results from the previous visit will be discussed. °· You may have a cervical check near your due date to see if you have effaced. °At around 36 weeks, your caregiver will check your cervix. At the same time, your caregiver will also perform a test on the secretions of the vaginal tissue. This test is to determine if a type of bacteria, Group B streptococcus, is present. Your caregiver will explain this further. °Your caregiver may ask you: °· What your birth plan is. °· How you are feeling. °· If you are feeling the baby move. °· If you have had any abnormal symptoms, such as leaking fluid, bleeding, severe headaches, or abdominal cramping. °· If you are using any tobacco products, including cigarettes, chewing tobacco, and electronic cigarettes. °· If you have any questions. °Other tests or screenings that may be performed during your third trimester include: °· Blood tests that check for low iron levels (anemia). °· Fetal testing to check the health, activity level, and growth of the fetus. Testing is done if you have certain medical conditions or if   there are problems during the pregnancy. °· HIV (human immunodeficiency virus) testing. If you are at high risk, you may be screened for HIV during your third trimester of pregnancy. °FALSE LABOR °You may feel small, irregular contractions that eventually go away. These are called Braxton Hicks contractions, or false labor. Contractions may last for hours, days, or even weeks before true labor sets in. If contractions come at regular intervals, intensify, or become painful, it is best to be seen by your  caregiver.  °SIGNS OF LABOR  °· Menstrual-like cramps. °· Contractions that are 5 minutes apart or less. °· Contractions that start on the top of the uterus and spread down to the lower abdomen and back. °· A sense of increased pelvic pressure or back pain. °· A watery or bloody mucus discharge that comes from the vagina. °If you have any of these signs before the 37th week of pregnancy, call your caregiver right away. You need to go to the hospital to get checked immediately. °HOME CARE INSTRUCTIONS  °· Avoid all smoking, herbs, alcohol, and unprescribed drugs. These chemicals affect the formation and growth of the baby. °· Do not use any tobacco products, including cigarettes, chewing tobacco, and electronic cigarettes. If you need help quitting, ask your health care provider. You may receive counseling support and other resources to help you quit. °· Follow your caregiver's instructions regarding medicine use. There are medicines that are either safe or unsafe to take during pregnancy. °· Exercise only as directed by your caregiver. Experiencing uterine cramps is a good sign to stop exercising. °· Continue to eat regular, healthy meals. °· Wear a good support bra for breast tenderness. °· Do not use hot tubs, steam rooms, or saunas. °· Wear your seat belt at all times when driving. °· Avoid raw meat, uncooked cheese, cat litter boxes, and soil used by cats. These carry germs that can cause birth defects in the baby. °· Take your prenatal vitamins. °· Take 1500-2000 mg of calcium daily starting at the 20th week of pregnancy until you deliver your baby. °· Try taking a stool softener (if your caregiver approves) if you develop constipation. Eat more high-fiber foods, such as fresh vegetables or fruit and whole grains. Drink plenty of fluids to keep your urine clear or pale yellow. °· Take warm sitz baths to soothe any pain or discomfort caused by hemorrhoids. Use hemorrhoid cream if your caregiver approves. °· If  you develop varicose veins, wear support hose. Elevate your feet for 15 minutes, 3-4 times a day. Limit salt in your diet. °· Avoid heavy lifting, wear low heal shoes, and practice good posture. °· Rest a lot with your legs elevated if you have leg cramps or low back pain. °· Visit your dentist if you have not gone during your pregnancy. Use a soft toothbrush to brush your teeth and be gentle when you floss. °· A sexual relationship may be continued unless your caregiver directs you otherwise. °· Do not travel far distances unless it is absolutely necessary and only with the approval of your caregiver. °· Take prenatal classes to understand, practice, and ask questions about the labor and delivery. °· Make a trial run to the hospital. °· Pack your hospital bag. °· Prepare the baby's nursery. °· Continue to go to all your prenatal visits as directed by your caregiver. °SEEK MEDICAL CARE IF: °· You are unsure if you are in labor or if your water has broken. °· You have dizziness. °· You have   mild pelvic cramps, pelvic pressure, or nagging pain in your abdominal area.  You have persistent nausea, vomiting, or diarrhea.  You have a bad smelling vaginal discharge.  You have pain with urination. SEEK IMMEDIATE MEDICAL CARE IF:   You have a fever.  You are leaking fluid from your vagina.  You have spotting or bleeding from your vagina.  You have severe abdominal cramping or pain.  You have rapid weight loss or gain.  You have shortness of breath with chest pain.  You notice sudden or extreme swelling of your face, hands, ankles, feet, or legs.  You have not felt your baby move in over an hour.  You have severe headaches that do not go away with medicine.  You have vision changes.   This information is not intended to replace advice given to you by your health care provider. Make sure you discuss any questions you have with your health care provider.   Document Released: 06/12/2001 Document  Revised: 07/09/2014 Document Reviewed: 08/19/2012 Elsevier Interactive Patient Education 2016 [place]    In vertical position, bring one hip and knee to 90 position, straighten leg forward, keeping toes pointed up. Return to 90 then straighten leg downward. Perform ___ times alternating legs.  Copyright  VHI. All rights reserved.

## 2015-06-11 NOTE — MAU Note (Addendum)
Patient having contractions since 8pm. No leaking or bleeding.

## 2015-06-19 ENCOUNTER — Inpatient Hospital Stay (HOSPITAL_COMMUNITY)
Admission: AD | Admit: 2015-06-19 | Discharge: 2015-06-21 | DRG: 775 | Disposition: A | Discharge status: Home / Self Care | Payer: BLUE CROSS/BLUE SHIELD | Source: Ambulatory Visit | Attending: Obstetrics and Gynecology | Admitting: Obstetrics and Gynecology

## 2015-06-19 ENCOUNTER — Encounter (HOSPITAL_COMMUNITY): Payer: Self-pay

## 2015-06-19 ENCOUNTER — Inpatient Hospital Stay (HOSPITAL_COMMUNITY): Payer: BLUE CROSS/BLUE SHIELD | Admitting: Anesthesiology

## 2015-06-19 DIAGNOSIS — O09523 Supervision of elderly multigravida, third trimester: Secondary | ICD-10-CM

## 2015-06-19 DIAGNOSIS — O3433 Maternal care for cervical incompetence, third trimester: Secondary | ICD-10-CM | POA: Diagnosis present

## 2015-06-19 DIAGNOSIS — Z8249 Family history of ischemic heart disease and other diseases of the circulatory system: Secondary | ICD-10-CM

## 2015-06-19 DIAGNOSIS — O99214 Obesity complicating childbirth: Secondary | ICD-10-CM | POA: Diagnosis present

## 2015-06-19 DIAGNOSIS — O9962 Diseases of the digestive system complicating childbirth: Secondary | ICD-10-CM | POA: Diagnosis present

## 2015-06-19 DIAGNOSIS — K219 Gastro-esophageal reflux disease without esophagitis: Secondary | ICD-10-CM | POA: Diagnosis present

## 2015-06-19 DIAGNOSIS — Z6841 Body Mass Index (BMI) 40.0 and over, adult: Secondary | ICD-10-CM | POA: Diagnosis not present

## 2015-06-19 DIAGNOSIS — Z833 Family history of diabetes mellitus: Secondary | ICD-10-CM

## 2015-06-19 DIAGNOSIS — Z3A37 37 weeks gestation of pregnancy: Secondary | ICD-10-CM

## 2015-06-19 HISTORY — DX: Polycystic ovarian syndrome: E28.2

## 2015-06-19 LAB — TYPE AND SCREEN
ABO/RH(D): O POS
Antibody Screen: NEGATIVE

## 2015-06-19 LAB — RPR: RPR Ser Ql: NONREACTIVE

## 2015-06-19 LAB — CBC
HEMATOCRIT: 35.4 % — AB (ref 36.0–46.0)
HEMOGLOBIN: 11.7 g/dL — AB (ref 12.0–15.0)
MCH: 28 pg (ref 26.0–34.0)
MCHC: 33.1 g/dL (ref 30.0–36.0)
MCV: 84.7 fL (ref 78.0–100.0)
Platelets: 227 10*3/uL (ref 150–400)
RBC: 4.18 MIL/uL (ref 3.87–5.11)
RDW: 13.9 % (ref 11.5–15.5)
WBC: 9.8 10*3/uL (ref 4.0–10.5)

## 2015-06-19 LAB — POCT FERN TEST: POCT FERN TEST: POSITIVE

## 2015-06-19 MED ORDER — SIMETHICONE 80 MG PO CHEW: 80.0000 mg | CHEWABLE_TABLET | ORAL | Status: DC | PRN

## 2015-06-19 MED ORDER — WITCH HAZEL-GLYCERIN EX PADS: 1.0000 "application " | MEDICATED_PAD | CUTANEOUS | Status: DC | PRN

## 2015-06-19 MED ORDER — LACTATED RINGERS IV SOLN
500.0000 mL | INTRAVENOUS | Status: DC | PRN
Start: 2015-06-19 — End: 2015-06-19
  Administered 2015-06-19: 250 mL via INTRAVENOUS
  Administered 2015-06-19 (×2): 500 mL via INTRAVENOUS

## 2015-06-19 MED ORDER — DIBUCAINE 1 % RE OINT: 1.0000 "application " | TOPICAL_OINTMENT | RECTAL | Status: DC | PRN

## 2015-06-19 MED ORDER — ONDANSETRON HCL 4 MG/2ML IJ SOLN: 4.0000 mg | INTRAMUSCULAR | Status: DC | PRN

## 2015-06-19 MED ORDER — BENZOCAINE-MENTHOL 20-0.5 % EX AERO
1.0000 "application " | INHALATION_SPRAY | CUTANEOUS | Status: DC | PRN
  Administered 2015-06-19: 1 via TOPICAL
  Filled 2015-06-19: qty 56

## 2015-06-19 MED ORDER — OXYTOCIN BOLUS FROM INFUSION
500.0000 mL | INTRAVENOUS | Status: DC
  Administered 2015-06-19: 500 mL via INTRAVENOUS

## 2015-06-19 MED ORDER — OXYTOCIN 40 UNITS IN LACTATED RINGERS INFUSION - SIMPLE MED
1.0000 m[IU]/min | INTRAVENOUS | Status: DC
  Administered 2015-06-19: 2 m[IU]/min via INTRAVENOUS

## 2015-06-19 MED ORDER — METFORMIN HCL ER 500 MG PO TB24
500.0000 mg | ORAL_TABLET | Freq: Every day | ORAL | Status: DC
  Administered 2015-06-19 – 2015-06-21 (×3): 500 mg via ORAL
  Filled 2015-06-19 (×3): qty 1

## 2015-06-19 MED ORDER — CITRIC ACID-SODIUM CITRATE 334-500 MG/5ML PO SOLN: 30.0000 mL | ORAL | Status: DC | PRN

## 2015-06-19 MED ORDER — LACTATED RINGERS IV SOLN
INTRAVENOUS | Status: DC
  Administered 2015-06-19 (×2): via INTRAVENOUS

## 2015-06-19 MED ORDER — ESCITALOPRAM OXALATE 20 MG PO TABS
20.0000 mg | ORAL_TABLET | Freq: Every day | ORAL | Status: DC
  Administered 2015-06-19 – 2015-06-20 (×2): 20 mg via ORAL
  Filled 2015-06-19 (×2): qty 1

## 2015-06-19 MED ORDER — PRENATAL MULTIVITAMIN CH
1.0000 | ORAL_TABLET | Freq: Every day | ORAL | Status: DC
  Administered 2015-06-20: 1 via ORAL
  Filled 2015-06-19: qty 1

## 2015-06-19 MED ORDER — TERBUTALINE SULFATE 1 MG/ML IJ SOLN
0.2500 mg | Freq: Once | INTRAMUSCULAR | Status: DC | PRN
Start: 2015-06-19 — End: 2015-06-19
  Filled 2015-06-19: qty 1

## 2015-06-19 MED ORDER — DIPHENHYDRAMINE HCL 50 MG/ML IJ SOLN: 12.5000 mg | INTRAMUSCULAR | Status: DC | PRN

## 2015-06-19 MED ORDER — PHENYLEPHRINE 40 MCG/ML (10ML) SYRINGE FOR IV PUSH (FOR BLOOD PRESSURE SUPPORT)
80.0000 ug | PREFILLED_SYRINGE | INTRAVENOUS | Status: DC | PRN
  Filled 2015-06-19: qty 20
  Filled 2015-06-19: qty 2

## 2015-06-19 MED ORDER — SENNOSIDES-DOCUSATE SODIUM 8.6-50 MG PO TABS
2.0000 | ORAL_TABLET | ORAL | Status: DC
  Administered 2015-06-20 (×2): 2 via ORAL
  Filled 2015-06-19 (×2): qty 2

## 2015-06-19 MED ORDER — IBUPROFEN 600 MG PO TABS
600.0000 mg | ORAL_TABLET | Freq: Four times a day (QID) | ORAL | Status: DC
  Administered 2015-06-19 – 2015-06-21 (×7): 600 mg via ORAL
  Filled 2015-06-19 (×7): qty 1

## 2015-06-19 MED ORDER — LIDOCAINE HCL (PF) 1 % IJ SOLN
INTRAMUSCULAR | Status: DC | PRN
  Administered 2015-06-19 (×2): 4 mL via EPIDURAL

## 2015-06-19 MED ORDER — OXYTOCIN 40 UNITS IN LACTATED RINGERS INFUSION - SIMPLE MED
62.5000 mL/h | INTRAVENOUS | Status: DC
Start: 2015-06-19 — End: 2015-06-19
  Filled 2015-06-19 (×2): qty 1000

## 2015-06-19 MED ORDER — ZOLPIDEM TARTRATE 5 MG PO TABS: 5.0000 mg | ORAL_TABLET | Freq: Every evening | ORAL | Status: DC | PRN

## 2015-06-19 MED ORDER — ACETAMINOPHEN 325 MG PO TABS: 650.0000 mg | ORAL_TABLET | ORAL | Status: DC | PRN

## 2015-06-19 MED ORDER — TETANUS-DIPHTH-ACELL PERTUSSIS 5-2.5-18.5 LF-MCG/0.5 IM SUSP
0.5000 mL | Freq: Once | INTRAMUSCULAR | Status: AC
  Administered 2015-06-20: 0.5 mL via INTRAMUSCULAR
  Filled 2015-06-19: qty 0.5

## 2015-06-19 MED ORDER — FLEET ENEMA 7-19 GM/118ML RE ENEM: 1.0000 | ENEMA | RECTAL | Status: DC | PRN

## 2015-06-19 MED ORDER — ONDANSETRON HCL 4 MG PO TABS: 4.0000 mg | ORAL_TABLET | ORAL | Status: DC | PRN

## 2015-06-19 MED ORDER — LANOLIN HYDROUS EX OINT: TOPICAL_OINTMENT | CUTANEOUS | Status: DC | PRN

## 2015-06-19 MED ORDER — EPHEDRINE 5 MG/ML INJ
10.0000 mg | INTRAVENOUS | Status: DC | PRN
  Filled 2015-06-19: qty 2

## 2015-06-19 MED ORDER — LIDOCAINE HCL (PF) 1 % IJ SOLN
30.0000 mL | INTRAMUSCULAR | Status: DC | PRN
  Filled 2015-06-19: qty 30

## 2015-06-19 MED ORDER — METHYLERGONOVINE MALEATE 0.2 MG PO TABS: 0.2000 mg | ORAL_TABLET | ORAL | Status: DC | PRN

## 2015-06-19 MED ORDER — FENTANYL 2.5 MCG/ML BUPIVACAINE 1/10 % EPIDURAL INFUSION (WH - ANES)
14.0000 mL/h | INTRAMUSCULAR | Status: DC | PRN
  Administered 2015-06-19 (×2): 14 mL/h via EPIDURAL
  Filled 2015-06-19 (×2): qty 125

## 2015-06-19 MED ORDER — DIPHENHYDRAMINE HCL 25 MG PO CAPS: 25.0000 mg | ORAL_CAPSULE | Freq: Four times a day (QID) | ORAL | Status: DC | PRN

## 2015-06-19 MED ORDER — ONDANSETRON HCL 4 MG/2ML IJ SOLN
4.0000 mg | Freq: Four times a day (QID) | INTRAMUSCULAR | Status: DC | PRN
  Administered 2015-06-19: 4 mg via INTRAVENOUS
  Filled 2015-06-19: qty 2

## 2015-06-19 MED ORDER — METHYLERGONOVINE MALEATE 0.2 MG/ML IJ SOLN: 0.2000 mg | INTRAMUSCULAR | Status: DC | PRN

## 2015-06-19 NOTE — MAU Note (Signed)
Report called to Laddie Aquas RN in [place].

## 2015-06-19 NOTE — Progress Notes (Signed)
Mom asked if she could start pumping with a debp now to help her milk come in.  Despite education from her nurses, patient and father remain focused that baby "is not getting anything and needs to eat."  Mom said baby is frustrated at breast because "nothing is there."  Baby did receive a bottle earlier. Mom was educated with the debp and is now pumping.Emphasized that it may take several pumpings before any is expressed.  This nurse tried hand expression (both breasts) and was unsuccesful.  Encouraged mom to put the baby to breast first before supplementing and that we would help her.

## 2015-06-19 NOTE — Anesthesia Procedure Notes (Signed)
Epidural Patient location during procedure: OB Start time: 06/19/2015 3:00 AM  Staffing Anesthesiologist: Josephine Igo Performed by: anesthesiologist   Preanesthetic Checklist Completed: patient identified, site marked, surgical consent, pre-op evaluation, timeout performed, IV checked, risks and benefits discussed and monitors and equipment checked  Epidural Patient position: sitting Prep: site prepped and draped and DuraPrep Patient monitoring: continuous pulse ox and blood pressure Approach: midline Location: L3-L4 Injection technique: LOR air  Needle:  Needle type: Tuohy  Needle gauge: 17 G Needle length: 9 cm and 9 Needle insertion depth: 6 cm Catheter type: closed end flexible Catheter size: 19 Gauge Catheter at skin depth: 11 cm Test dose: negative and Other  Assessment Events: blood not aspirated, injection not painful, no injection resistance, negative IV test and no paresthesia  Additional Notes Patient identified. Risks and benefits discussed including failed block, incomplete  Pain control, post dural puncture headache, nerve damage, paralysis, blood pressure Changes, nausea, vomiting, reactions to medications-both toxic and allergic and post Partum back pain. All questions were answered. Patient expressed understanding and wished to proceed. Sterile technique was used throughout procedure. Epidural site was Dressed with sterile barrier dressing. No paresthesias, signs of intravascular injection Or signs of intrathecal spread were encountered.  Patient was more comfortable after the epidural was dosed. Please see RN's note for documentation of vital signs and FHR which are stable.

## 2015-06-19 NOTE — MAU Note (Signed)
Leaking fld for last 26mins. Heard a "pop" and started leaking and hasn';t stopped. Occ ctxs. 1.5cm last sve

## 2015-06-19 NOTE — Lactation Note (Signed)
This note was copied from the chart of Marie Gali Runion. Lactation Consultation Note  Patient Name: Marie Wang [id number] Date: 06/19/2015 Reason for consult: Initial assessment Baby at 30 hr of life and mom is worried that baby is not latching well or getting enough. Discussed baby belly size, milk transition, breast changes, pumping, artifical nipples, and formula. Mom stated several times that she wants to bf but the baby needs formula too. With her older child, she had latch issues, did not pump, got engorged, got mastitis, and quit. She has a DEBP set up in her room and baby was able to latch well for 15 minutes, her breast do feel like she has colostrum. Mom was able to manually express today and colostrum was noted bilaterally. Given lactation handouts. Aware of OP services and support group.     Maternal Data    Feeding Feeding Type: Breast Fed Length of feed: 15 min  LATCH Score/Interventions Latch: Repeated attempts needed to sustain latch, nipple held in mouth throughout feeding, stimulation needed to elicit sucking reflex. Intervention(s): Skin to skin Intervention(s): Adjust position;Assist with latch;Breast compression  Audible Swallowing: Spontaneous and intermittent Intervention(s): Hand expression;Skin to skin Intervention(s): Alternate breast massage;Hand expression;Skin to skin  Type of Nipple: Everted at rest and after stimulation Intervention(s): No intervention needed  Comfort (Breast/Nipple): Soft / non-tender     Hold (Positioning): Full assist, staff holds infant at breast Intervention(s): Support Pillows;Position options  LATCH Score: 7  Lactation Tools Discussed/Used     Consult Status Consult Status: Follow-up Date: 06/20/15 Follow-up type: In-patient    Denzil Hughes 06/19/2015, 10:15 PM

## 2015-06-19 NOTE — Progress Notes (Signed)
Marie Wang is a 39 y.o. [phone] at [redacted]w[redacted]d by LMP admitted for active labor, rupture of membranes  Subjective: Feels pressure  Objective: BP 131/66 mmHg  Pulse 101  Temp(Src) 98.2 F (36.8 C) (Oral)  Resp 18  Ht 5\' 4"  (1.626 m)  Wt 123.378 kg (272 lb)  BMI 46.67 kg/m2  SpO2 100%  LMP 09/30/2014 I/O last 3 completed shifts: In: -  Out: 150 [Urine:150]    FHT:  FHR: 125 bpm, variability: moderate,  accelerations:  Present,  decelerations:  Absent UC:   irregular, every 5-6 minutes SVE:   Dilation: Lip/rim Effacement (%): 100 Station: -1, 0 Exam by:: Glade Nurse RN  Labs: Lab Results  Component Value Date   WBC 9.8 06/19/2015   HGB 11.7* 06/19/2015   HCT 35.4* 06/19/2015   MCV 84.7 06/19/2015   PLT 227 06/19/2015    Assessment / Plan: Protracted active phase- hypoactive labor Start Pitocin augmentation  Labor: minimum progress Preeclampsia:  no signs or symptoms of toxicity Fetal Wellbeing:  Category I Pain Control:  Epidural I/D:  n/a Anticipated MOD:  NSVD  Marie Wang J 06/19/2015, 10:25 AM

## 2015-06-19 NOTE — Anesthesia Postprocedure Evaluation (Signed)
Anesthesia Post Note  Patient: Marie Wang  Procedure(s) Performed: * No procedures listed *  Patient location during evaluation: Mother Baby Anesthesia Type: Epidural Level of consciousness: awake and alert and oriented Pain management: pain level controlled Vital Signs Assessment: post-procedure vital signs reviewed and stable Respiratory status: spontaneous breathing and respiratory function stable Cardiovascular status: blood pressure returned to baseline Postop Assessment: no headache, no backache, epidural receding, patient able to bend at knees, adequate PO intake and no signs of nausea or vomiting Anesthetic complications: no    Last Vitals:  Filed Vitals:   06/19/15 1334 06/19/15 1434  BP: 125/54 133/61  Pulse: 100 95  Temp: 36.8 C 37.5 C  Resp: 17 17    Last Pain:  Filed Vitals:   06/19/15 1435  PainSc: 2                  Fredrika Canby

## 2015-06-19 NOTE — Anesthesia Preprocedure Evaluation (Addendum)
Anesthesia Evaluation  Patient identified by MRN, date of birth, ID band Patient awake    Airway Mallampati: III  TM Distance: >3 FB Neck ROM: Full    Dental no notable dental hx. (+) Teeth Intact   Pulmonary neg pulmonary ROS, asthma ,    Pulmonary exam normal breath sounds clear to auscultation       Cardiovascular negative cardio ROS Normal cardiovascular exam Rhythm:Regular Rate:Normal     Neuro/Psych Anxiety negative neurological ROS     GI/Hepatic Neg liver ROS, GERD  Medicated and Controlled,  Endo/Other  Morbid obesityPCOS  Renal/GU negative Renal ROS  negative genitourinary   Musculoskeletal negative musculoskeletal ROS (+)   Abdominal (+) + obese,   Peds  Hematology  (+) anemia ,   Anesthesia Other Findings   Reproductive/Obstetrics (+) Pregnancy                            Anesthesia Physical Anesthesia Plan  ASA: III  Anesthesia Plan: Epidural   Post-op Pain Management:    Induction:   Airway Management Planned: Natural Airway  Additional Equipment:   Intra-op Plan:   Post-operative Plan:   Informed Consent: I have reviewed the patients History and Physical, chart, labs and discussed the procedure including the risks, benefits and alternatives for the proposed anesthesia with the patient or authorized representative who has indicated his/her understanding and acceptance.     Plan Discussed with: Anesthesiologist  Anesthesia Plan Comments:         Anesthesia Quick Evaluation

## 2015-06-19 NOTE — H&P (Addendum)
Marie Wang is a 39 y.o. female presenting for SROM at term.  Maternal Medical History:  Reason for admission: Rupture of membranes.   Contractions: Onset was less than 1 hour ago.   Frequency: irregular.   Perceived severity is moderate.    Fetal activity: Perceived fetal activity is normal.   Last perceived fetal movement was within the past hour.    Prenatal complications: Preterm labor.   Prenatal Complications - Diabetes: none.    OB History    Gravida Para Term Preterm AB TAB SAB Ectopic Multiple Living   5 1 0 1 3 1 2   1      Past Medical History  Diagnosis Date  . Chlamydia 2009  . Hx gestational diabetes   . Anxiety   . Leiomyoma 2013    small  . Miscarriage   . Fibroid   . Vaginal bleeding during pregnancy, antepartum 01/12/2015  . Cervical insufficiency during pregnancy in second trimester, antepartum 03/22/2015  . Polycystic ovaries    Past Surgical History  Procedure Laterality Date  . Mouth surgery  1996   Family History: family history includes Breast cancer (age of onset: 51) in her paternal aunt; Diabetes in her father and paternal grandmother; Hypertension in her father and mother; Ovarian cancer in her maternal grandmother. Social History:  reports that she has never smoked. She has never used smokeless tobacco. She reports that she drinks alcohol. She reports that she does not use illicit drugs.   Prenatal Transfer Tool  Maternal Diabetes: No Genetic Screening: Normal Maternal Ultrasounds/Referrals: Normal Fetal Ultrasounds or other Referrals:  None Maternal Substance Abuse:  No Significant Maternal Medications:  None Significant Maternal Lab Results:  None Other Comments:  None  Review of Systems  Constitutional: Negative.   All other systems reviewed and are negative.   Dilation: 7.5 Effacement (%): 100 Station: -2 Exam by:: Veronica Mensah Blood pressure 126/67, pulse 93, temperature 98.8 F (37.1 C), temperature source Oral,  resp. rate 18, height 5\' 4"  (1.626 m), weight 123.378 kg (272 lb), last menstrual period 09/30/2014, SpO2 100 %. Maternal Exam:  Uterine Assessment: Contraction strength is moderate.  Contraction frequency is irregular.   Abdomen: Patient reports no abdominal tenderness. Fetal presentation: vertex  Introitus: Normal vulva. Normal vagina.  Ferning test: positive.  Nitrazine test: positive. Amniotic fluid character: clear.  Pelvis: adequate for delivery.   Cervix: Cervix evaluated by digital exam.     Physical Exam  Nursing note and vitals reviewed. Constitutional: She is oriented to person, place, and time. She appears well-developed and well-nourished.  HENT:  Head: Normocephalic and atraumatic.  Neck: Normal range of motion. Neck supple. No thyromegaly present.  Cardiovascular: Normal rate and regular rhythm.   Respiratory: Effort normal and breath sounds normal.  GI: Soft. Bowel sounds are normal.  Genitourinary: Vagina normal and uterus normal.  Musculoskeletal: Normal range of motion.  Neurological: She is alert and oriented to person, place, and time.  Skin: Skin is warm and dry.  Psychiatric: She has a normal mood and affect.    Prenatal labs: ABO, Rh: --/--/O POS (12/18 0145) Antibody: NEG (12/18 0145) Rubella: Immune (06/02 0000) RPR: Nonreactive (06/02 0000)  HBsAg: Negative (06/02 0000)  HIV: Non-reactive (06/02 0000)  GBS: Negative (12/06 0000)   Assessment/Plan: Term IUP in early labor with SROM History of PTL- BMZ completed Admit Epidural   Navon Kotowski J 06/19/2015, 7:09 AM

## 2015-06-19 NOTE — Progress Notes (Signed)
Marie Wang is a 39 y.o. [phone] at [redacted]w[redacted]d by LMP admitted for active labor, rupture of membranes  Subjective: comfortable  Objective: BP 126/67 mmHg  Pulse 93  Temp(Src) 98.8 F (37.1 C) (Oral)  Resp 18  Ht 5\' 4"  (1.626 m)  Wt 123.378 kg (272 lb)  BMI 46.67 kg/m2  SpO2 100%  LMP 09/30/2014 I/O last 3 completed shifts: In: -  Out: 150 [Urine:150]    FHT:  FHR: 120 bpm, variability: moderate,  accelerations:  Present,  decelerations:  Absent UC:   irregular, every 3-7 minutes SVE:   Dilation: 7.5 Effacement (%): 100 Station: -2 Exam by:: [employer]: Lab Results  Component Value Date   WBC 9.8 06/19/2015   HGB 11.7* 06/19/2015   HCT 35.4* 06/19/2015   MCV 84.7 06/19/2015   PLT 227 06/19/2015    Assessment / Plan: Spontaneous labor, progressing normally  Labor: Progressing normally Preeclampsia:  no signs or symptoms of toxicity Fetal Wellbeing:  Category I Pain Control:  Epidural I/D:  n/a Anticipated MOD:  NSVD  Zyrion Coey J 06/19/2015, 7:13 AM

## 2015-06-19 NOTE — Progress Notes (Signed)
Baby set up to breast feed and fed for 15 minutes with deep latch and intermittent sucking. Mom requested a bottle right after this feed stating she was concerned that baby was hungry still. This RN explained that colostrum has more calories, this RN expressed colostrum to show mom she was producing this for baby, this RN suggested finger feeding instead of a bottle (which mom refused), and this RN explained the added risks of formula (allergies, asthma, nipple confusion, decreased milk supply, engorgement). Mom/patient understands the above and still wishes to bottle feed baby.

## 2015-06-20 LAB — CBC
HEMATOCRIT: 31.6 % — AB (ref 36.0–46.0)
HEMOGLOBIN: 10.2 g/dL — AB (ref 12.0–15.0)
MCH: 28.3 pg (ref 26.0–34.0)
MCHC: 32.3 g/dL (ref 30.0–36.0)
MCV: 87.8 fL (ref 78.0–100.0)
Platelets: 199 10*3/uL (ref 150–400)
RBC: 3.6 MIL/uL — ABNORMAL LOW (ref 3.87–5.11)
RDW: 14.1 % (ref 11.5–15.5)
WBC: 14.1 10*3/uL — ABNORMAL HIGH (ref 4.0–10.5)

## 2015-06-20 MED ORDER — OXYCODONE-ACETAMINOPHEN 5-325 MG PO TABS: 2.0000 | ORAL_TABLET | ORAL | Status: DC | PRN

## 2015-06-20 MED ORDER — OXYCODONE-ACETAMINOPHEN 5-325 MG PO TABS: 1.0000 | ORAL_TABLET | ORAL | Status: DC | PRN

## 2015-06-20 NOTE — Lactation Note (Signed)
This note was copied from the chart of Marie Wang. Lactation Consultation Note  Baby is asleep on mom's chest. Mom reports that she is using the NS prefilled with formula because her nipples were edematous earlier.  Baby is also being supplemented with formula using syringe finger feeding.  Encouraged mother to pump with double electric breast pump if any feedings were replaced with formula.  She has private insurance and plans to call them regarding acquiring a breast pump.  Patient Name: Marie Anjali Kleinfeldt [id number] Date: 06/20/2015 Reason for consult: Follow-up assessment   Maternal Data Has patient been taught Hand Expression?: Yes  Feeding Feeding Type: Breast Fed Length of feed: 5 min  LATCH Score/Interventions                      Lactation Tools Discussed/Used     Consult Status Consult Status: Follow-up Date: 06/21/15 Follow-up type: In-patient    Van Clines 06/20/2015, 3:24 PM

## 2015-06-20 NOTE — Progress Notes (Signed)
PPD #1- SVD  Subjective:   Reports feeling well Tolerating po/ No nausea or vomiting Bleeding is light Pain controlled with Motrin Up ad lib / ambulatory / voiding without problems Newborn: breast and formula feeding-some trouble with latch  / Circumcision: planning  Objective:   VS:  VS:  Filed Vitals:   06/19/15 1334 06/19/15 1434 06/19/15 1746 06/20/15 0620  BP: 125/54 133/61 122/70 114/53  Pulse: 100 95 92 68  Temp: 98.3 F (36.8 C) 99.5 F (37.5 C) 98.2 F (36.8 C) 98.1 F (36.7 C)  TempSrc: Axillary Axillary Oral Oral  Resp: 17 17 16 18   Height:      Weight:      SpO2:   100% 100%    LABS:  Recent Labs  06/19/15 0145 06/20/15 0510  WBC 9.8 14.1*  HGB 11.7* 10.2*  PLT 227 199   Blood type: --/--/O POS (12/18 0145) Rubella: Immune (06/02 0000)   I&O: Intake/Output      12/18 0701 - 12/19 0700 12/19 0701 - 12/20 0700   Urine (mL/kg/hr) 300 (0.1)    Blood 217 (0.1)    Total Output 517     Net -517            Physical Exam: Alert and oriented x3 Abdomen: soft, non-tender, non-distended  Fundus: firm, non-tender, U-2 Perineum: intact, no edema or bruising Lochia: small Extremities: No edema, no calf pain or tenderness   Assessment:  PPD #1 [id number]/ S/P:spontaneous vaginal  Doing well   Plan: Continue routine post partum orders Anticipate D/C home tomorrow   Julianne Handler, N MSN, CNM 06/20/2015, 12:19 PM

## 2015-06-21 MED ORDER — IBUPROFEN 600 MG PO TABS: 600.0000 mg | ORAL_TABLET | Freq: Four times a day (QID) | ORAL | Status: DC

## 2015-06-21 NOTE — Progress Notes (Signed)
PPD 2 SVD - no repair / intact  S:  Reports feeling well - ready to go hom             Tolerating po/ No nausea or vomiting             Bleeding is light             Pain controlled with motrin             Up ad lib / ambulatory / voiding QS  Newborn breast & formula feeding   O:               VS: BP 133/82 mmHg  Pulse 88  Temp(Src) 98.1 F (36.7 C) (Oral)  Resp 20  Ht 5\' 4"  (1.626 m)  Wt 123.378 kg (272 lb)  BMI 46.67 kg/m2  SpO2 100%  LMP 09/30/2014  Breastfeeding? Unknown   LABS:              Recent Labs  06/19/15 0145 06/20/15 0510  WBC 9.8 14.1*  HGB 11.7* 10.2*  PLT 227 199               Blood type: --/--/O POS (12/18 0145)  Rubella: Immune (06/02 0000)                             Physical Exam:             Alert and oriented X3  Abdomen: soft, non-tender, non-distended              Fundus: firm, non-tender, Ueven  Perineum: intact  Lochia: light  Extremities: 1+ pedal edema, no calf pain or tenderness   A: PPD # 2   Doing well - stable status  P: Routine post partum orders  DC home - WOB booklet - instructions reviewed  Artelia Laroche CNM, MSN, [facility] 06/21/2015, 8:44 AM

## 2015-06-21 NOTE — Discharge Summary (Signed)
Obstetric Discharge Summary  Reason for Admission: onset of labor Prenatal Procedures: NST and ultrasound Intrapartum Procedures: spontaneous vaginal delivery Postpartum Procedures: none Complications-Operative and Postpartum: none HEMOGLOBIN  Date Value Ref Range Status  06/20/2015 10.2* 12.0 - 15.0 g/dL Final   HCT  Date Value Ref Range Status  06/20/2015 31.6* 36.0 - 46.0 % Final    Physical Exam:  General: alert, cooperative and no distress Lochia: appropriate Uterine Fundus: firm Perineum: intact / no repair DVT Evaluation: No evidence of DVT seen on physical exam.  Discharge Diagnoses: Term Pregnancy-delivered  Discharge Information: Date: 06/21/2015 Activity: pelvic rest Diet: routine Medications: PNV, Ibuprofen and Lexapro and Metformin and albuteral inhaler (PRN) Condition: stable Instructions: refer to practice specific booklet Discharge to: home Follow-up Information    Follow up with Lovenia [name], MD. Schedule an appointment as soon as possible for a visit in 6 weeks.   Specialty:  Obstetrics and Gynecology   Contact information:   [place] [address] [phone]       Newborn Data: Live born female  Birth Weight: 6 lb 13 oz (3090 g) APGAR: 8, 9  Home with mother.  Artelia Laroche 06/21/2015, 9:19 AM

## 2015-06-21 NOTE — Lactation Note (Signed)
This note was copied from the chart of Marie Evelynn Madera. Lactation Consultation Note  Patient Name: Marie Wang [id number] Date: 06/21/2015   Visited with Mom on day of discharge, baby 69 hrs old.  Mom feeding baby formula by bottle and pumping using the DEBP, due to "edematous" nipples.  Mom denies any nipple trauma, but describes pain on latch.  Offered an assist, but decided to wait until her milk comes in to breast feed.  Has 2 single electric pumps for home use until her insurance pump arrives.  Informed Mom and FOB about 2 weeks rental available to her.  Encouraged skin to skin, and cue based feedings using the volume guidelines available to her in her handouts.  Informed her of OP lactation services available.  Engorgement prevention and treatment discussed.  To call prn.    Broadus John 06/21/2015, 11:08 AM

## 2017-03-11 ENCOUNTER — Emergency Department (HOSPITAL_COMMUNITY)
Admission: EM | Admit: 2017-03-11 | Discharge: 2017-03-11 | Disposition: A | Discharge status: Home / Self Care | Payer: 59 | Attending: Emergency Medicine | Admitting: Emergency Medicine

## 2017-03-11 ENCOUNTER — Encounter (HOSPITAL_COMMUNITY): Payer: Self-pay

## 2017-03-11 ENCOUNTER — Emergency Department (HOSPITAL_COMMUNITY): Payer: 59

## 2017-03-11 DIAGNOSIS — J40 Bronchitis, not specified as acute or chronic: Secondary | ICD-10-CM

## 2017-03-11 DIAGNOSIS — Z79899 Other long term (current) drug therapy: Secondary | ICD-10-CM | POA: Diagnosis not present

## 2017-03-11 DIAGNOSIS — J209 Acute bronchitis, unspecified: Secondary | ICD-10-CM | POA: Diagnosis not present

## 2017-03-11 DIAGNOSIS — R002 Palpitations: Secondary | ICD-10-CM | POA: Diagnosis present

## 2017-03-11 LAB — COMPREHENSIVE METABOLIC PANEL
ALK PHOS: 84 U/L (ref 38–126)
ALT: 36 U/L (ref 14–54)
ANION GAP: 8 (ref 5–15)
AST: 33 U/L (ref 15–41)
Albumin: 3.7 g/dL (ref 3.5–5.0)
BUN: 10 mg/dL (ref 6–20)
CALCIUM: 8.8 mg/dL — AB (ref 8.9–10.3)
CO2: 24 mmol/L (ref 22–32)
CREATININE: 0.75 mg/dL (ref 0.44–1.00)
Chloride: 106 mmol/L (ref 101–111)
Glucose, Bld: 103 mg/dL — ABNORMAL HIGH (ref 65–99)
Potassium: 4 mmol/L (ref 3.5–5.1)
Sodium: 138 mmol/L (ref 135–145)
Total Bilirubin: 0.4 mg/dL (ref 0.3–1.2)
Total Protein: 7.4 g/dL (ref 6.5–8.1)

## 2017-03-11 LAB — CBC WITH DIFFERENTIAL/PLATELET
Basophils Absolute: 0.1 10*3/uL (ref 0.0–0.1)
Basophils Relative: 1 %
Eosinophils Absolute: 0.2 10*3/uL (ref 0.0–0.7)
Eosinophils Relative: 2 %
HCT: 32.4 % — ABNORMAL LOW (ref 36.0–46.0)
HEMOGLOBIN: 10.3 g/dL — AB (ref 12.0–15.0)
LYMPHS ABS: 2.5 10*3/uL (ref 0.7–4.0)
LYMPHS PCT: 35 %
MCH: 25.2 pg — AB (ref 26.0–34.0)
MCHC: 31.8 g/dL (ref 30.0–36.0)
MCV: 79.2 fL (ref 78.0–100.0)
MONOS PCT: 9 %
Monocytes Absolute: 0.6 10*3/uL (ref 0.1–1.0)
Neutro Abs: 3.8 10*3/uL (ref 1.7–7.7)
Neutrophils Relative %: 53 %
Platelets: 304 10*3/uL (ref 150–400)
RBC: 4.09 MIL/uL (ref 3.87–5.11)
RDW: 15.2 % (ref 11.5–15.5)
WBC: 7.1 10*3/uL (ref 4.0–10.5)

## 2017-03-11 LAB — I-STAT BETA HCG BLOOD, ED (MC, WL, AP ONLY): I-stat hCG, quantitative: <5 m[IU]/mL (ref <5)

## 2017-03-11 LAB — MAGNESIUM: MAGNESIUM: 2 mg/dL (ref 1.7–2.4)

## 2017-03-11 LAB — I-STAT TROPONIN, ED: TROPONIN I, POC: 0 ng/mL (ref 0.00–0.08)

## 2017-03-11 MED ORDER — BENZONATATE 100 MG PO CAPS: 100.0000 mg | ORAL_CAPSULE | Freq: Three times a day (TID) | ORAL | 0 refills | Status: DC | PRN

## 2017-03-11 MED ORDER — HYDROCODONE-HOMATROPINE 5-1.5 MG/5ML PO SYRP: 5.0000 mL | ORAL_SOLUTION | Freq: Four times a day (QID) | ORAL | 0 refills | Status: DC | PRN

## 2017-03-11 MED ORDER — DOXYCYCLINE HYCLATE 100 MG PO CAPS: 100.0000 mg | ORAL_CAPSULE | Freq: Two times a day (BID) | ORAL | 0 refills | Status: DC

## 2017-03-11 MED ORDER — SODIUM CHLORIDE 0.9 % IV BOLUS (SEPSIS)
1000.0000 mL | Freq: Once | INTRAVENOUS | Status: AC
  Administered 2017-03-11: 1000 mL via INTRAVENOUS

## 2017-03-11 NOTE — ED Triage Notes (Signed)
Patient reports that she ws seen  By her PCP 5 daya go and told her she had a URI and would probably clear up on its own. Today, the patient is still coughing and sneezing. Patient also reports that she had heart palpitations earlier, but not now.

## 2017-03-11 NOTE — ED Provider Notes (Signed)
[place] DEPT Provider Note   CSN: [id number] Arrival date & time: 03/11/17  0901     History   Chief Complaint Chief Complaint  Patient presents with  . Palpitations    HPI Marie Wang is a 41 y.o. female.  HPI   42 yo F with PMHx as below here with a 5-6 day history of cough, nasal congestion, and sneezing. She was seen by her PCP and told she had a URI, for which she was advised supportive care. Pt has since had worsening sinus congestion, sore throat, and cough. Her cough is productive of yellow-green sputum. No shortness of breath. She does note that she has had poor appetite but has been trying to drink water. She has noticed that she has had episodes of her heart racing when standing over this time period. No CP, no leg swelling. No pain with inspiration. She has had similar sx in past, which all spontaneously resolve. Has not seen a Cardiologist for this or her PCP.  Past Medical History:  Diagnosis Date  . Anxiety   . Cervical insufficiency during pregnancy in second trimester, antepartum 03/22/2015  . Chlamydia 2009  . Fibroid   . Hx gestational diabetes   . Leiomyoma 2013   small  . Miscarriage   . Polycystic ovaries   . Postpartum care following vaginal delivery (12/18) 06/19/2015  . Vaginal bleeding during pregnancy, antepartum 01/12/2015    Patient Active Problem List   Diagnosis Date Noted  . Normal labor 06/19/2015  . Postpartum care following vaginal delivery (12/18) 06/19/2015  . Cervical insufficiency during pregnancy in second trimester, antepartum 03/22/2015  . Cervical incompetence affecting management of pregnancy in second trimester, antepartum 03/22/2015  . Vaginal bleeding during pregnancy, antepartum 01/12/2015  . [redacted] weeks gestation of pregnancy   . Hx gestational diabetes     Past Surgical History:  Procedure Laterality Date  . MOUTH SURGERY  1996    OB History    Gravida Para Term Preterm AB Living   5 2 1 1 3 2    SAB TAB  Ectopic Multiple Live Births   2 1   0 2       Home Medications    Prior to Admission medications   Medication Sig Start Date End Date Taking? Authorizing Provider  albuterol (PROVENTIL HFA;VENTOLIN HFA) 108 (90 BASE) MCG/ACT inhaler Inhale 2 puffs into the lungs every 6 (six) hours as needed for wheezing or shortness of breath.   Yes [provider]  ALPRAZolam Duanne Moron) 0.25 MG tablet Take 1 tablet by mouth daily as needed for anxiety 02/02/17  Yes [provider]  dextromethorphan-guaiFENesin (MUCINEX DM) 30-600 MG 12hr tablet Take 1 tablet by mouth 2 (two) times daily as needed for cough.   Yes [provider]  escitalopram (LEXAPRO) 20 MG tablet Take 20 mg by mouth at bedtime.    Yes [provider]  ibuprofen (ADVIL,MOTRIN) 600 MG tablet Take 1 tablet (600 mg total) by mouth every 6 (six) hours. 06/21/15  Yes Artelia Laroche, CNM  metFORMIN (GLUCOPHAGE-XR) 500 MG 24 hr tablet Take 500 mg by mouth at bedtime.    Yes [provider]  Prenatal Vit-Fe Fumarate-FA (PRENATAL MULTIVITAMIN) TABS tablet Take 1 tablet by mouth at bedtime.   Yes [provider]  benzonatate (TESSALON) 100 MG capsule Take 1 capsule (100 mg total) by mouth 3 (three) times daily as needed for cough. 03/11/17   Duffy Bruce, MD  doxycycline (VIBRAMYCIN) 100 MG capsule Take  1 capsule (100 mg total) by mouth 2 (two) times daily. 03/11/17   Duffy Bruce, MD  HYDROcodone-homatropine [facility]) 5-1.5 MG/5ML syrup Take 5 mLs by mouth every 6 (six) hours as needed for cough. 03/11/17   Duffy Bruce, MD    Family History Family History  Problem Relation Age of Onset  . Hypertension Mother   . Breast cancer Paternal Aunt 73  . Ovarian cancer Maternal Grandmother   . Diabetes Paternal Grandmother   . Hypertension Father        borderline  . Diabetes Father     Social History Social History  Substance Use Topics  . Smoking status: Never Smoker  . Smokeless  tobacco: Never Used  . Alcohol use Yes     Comment: socially, not while pregnant     Allergies   Codeine; Sulfa antibiotics; Ciprofloxacin; and Prednisone   Review of Systems Review of Systems  Constitutional: Positive for fatigue. Negative for chills and fever.  HENT: Positive for congestion, rhinorrhea and sore throat.   Eyes: Negative for visual disturbance.  Respiratory: Positive for cough and shortness of breath. Negative for wheezing.   Cardiovascular: Positive for palpitations. Negative for chest pain and leg swelling.  Gastrointestinal: Negative for abdominal pain, diarrhea, nausea and vomiting.  Genitourinary: Negative for dysuria, flank pain, vaginal bleeding and vaginal discharge.  Musculoskeletal: Negative for neck pain.  Skin: Negative for rash.  Allergic/Immunologic: Negative for immunocompromised state.  Neurological: Negative for syncope and headaches.  Hematological: Does not bruise/bleed easily.  All other systems reviewed and are negative.    Physical Exam Updated Vital Signs BP 129/87 (BP Location: Right Arm)   Pulse 88   Temp 98.8 F (37.1 C) (Oral)   Resp 20   Ht 5\' 4"  (1.626 m)   Wt 117.9 kg (260 lb)   LMP 03/09/2017   SpO2 98%   BMI 44.63 kg/m   Physical Exam  Constitutional: She is oriented to person, place, and time. She appears well-developed and well-nourished. No distress.  HENT:  Head: Normocephalic and atraumatic.  Moderate nasal congestion bilaterally. Mild posterior pharyngeal erythema without tonsillar swelling or exudates.  Eyes: Conjunctivae are normal.  Neck: Neck supple.  Cardiovascular: Normal rate, regular rhythm and normal heart sounds.  Exam reveals no friction rub.   No murmur heard. Pulmonary/Chest: Effort normal and breath sounds normal. No respiratory distress. She has no wheezes. She has no rales.  Occasional course rhonchi, clear with coughing  Abdominal: She exhibits no distension.  Musculoskeletal: She exhibits no  edema.  Neurological: She is alert and oriented to person, place, and time. She exhibits normal muscle tone.  Skin: Skin is warm. Capillary refill takes less than 2 seconds.  Psychiatric: She has a normal mood and affect.  Nursing note and vitals reviewed.    ED Treatments / Results  Labs (all labs ordered are listed, but only abnormal results are displayed) Labs Reviewed  CBC WITH DIFFERENTIAL/PLATELET - Abnormal; Notable for the following:       Result Value   Hemoglobin 10.3 (*)    HCT 32.4 (*)    MCH 25.2 (*)    All other components within normal limits  COMPREHENSIVE METABOLIC PANEL - Abnormal; Notable for the following:    Glucose, Bld 103 (*)    Calcium 8.8 (*)    All other components within normal limits  MAGNESIUM  I-STAT TROPONIN, ED  I-STAT BETA HCG BLOOD, ED ([facility], [facility], [facility] ONLY)    EKG  EKG Interpretation  Date/Time:  Monday March 11 2017 09:15:22 EDT Ventricular Rate:  95 PR Interval:    QRS Duration: 82 QT Interval:  364 QTC Calculation: 458 R Axis:   74 Text Interpretation:  Sinus rhythm Borderline T abnormalities, anterior leads No significant change since last tracing Confirmed by Duffy Bruce [phone]) on 03/11/2017 9:42:35 AM       Radiology Dg Chest 2 View  Result Date: 03/11/2017 CLINICAL DATA:  Cough and chest tightness for 2 weeks EXAM: CHEST  2 VIEW COMPARISON:  05/09/2013 FINDINGS: Cardiac shadow is mildly enlarged but stable. The lungs are well aerated bilaterally. No focal infiltrate or sizable effusion is seen. No acute bony abnormality is noted. IMPRESSION: No active cardiopulmonary disease. Electronically Signed   By: Inez Catalina M.D.   On: 03/11/2017 11:14    Procedures Procedures (including critical care time)  Medications Ordered in ED Medications  sodium chloride 0.9 % bolus 1,000 mL (0 mLs Intravenous Stopped 03/11/17 1247)     Initial Impression / Assessment and Plan / ED Course  I have reviewed the triage vital signs and  the nursing notes.  Pertinent labs & imaging results that were available during my care of the patient were reviewed by me and considered in my medical decision making (see chart for details).     41 yo F with no significant PMHx here with cough, nasal congestion, and transient, symptomatic palpitations. I suspect pt currently has acute sinusitis/bronchitis. Her exam is c/w this. No signs of RPA or PTA. CXR is clear without focal pneumonia and she is satting well on RA. Her lab work is otherwise very reassuring and unremarkable. She does have known sick contacts.   Regarding her palpitations, unclear etiology. Some of this may be 2/2 her URI, possibly dehydration, versus possible side effect from taking frequent doses of DXM-guaifenesin. However, transient SVT is on DDx. Lytes normal, however. EKG without apparent arrhythmia. Ill encourage her to continue fluids, refer to PCP for outpt monitoring. Otherwise, she is well appearing and HDS. Will tx for sinusitis, encourage fluids, and d/c.  Final Clinical Impressions(s) / ED Diagnoses   Final diagnoses:  Bronchitis  Palpitations    New Prescriptions Discharge Medication List as of 03/11/2017  1:02 PM    START taking these medications   Details  benzonatate (TESSALON) 100 MG capsule Take 1 capsule (100 mg total) by mouth 3 (three) times daily as needed for cough., Starting Mon 03/11/2017, Print    doxycycline (VIBRAMYCIN) 100 MG capsule Take 1 capsule (100 mg total) by mouth 2 (two) times daily., Starting Mon 03/11/2017, Print    HYDROcodone-homatropine (HYCODAN) 5-1.5 MG/5ML syrup Take 5 mLs by mouth every 6 (six) hours as needed for cough., Starting Mon 03/11/2017, Print         Duffy Bruce, MD 03/12/17 [phone]

## 2017-03-11 NOTE — Discharge Instructions (Signed)
Drink plenty of fluids.  Start taking the antibiotic.  Avoid excessive caffeine, heavy exercise, or weight loss supplements.

## 2017-08-20 ENCOUNTER — Other Ambulatory Visit: Payer: Self-pay | Admitting: Obstetrics and Gynecology

## 2017-08-20 DIAGNOSIS — R928 Other abnormal and inconclusive findings on diagnostic imaging of breast: Secondary | ICD-10-CM

## 2017-08-21 ENCOUNTER — Ambulatory Visit
Admission: RE | Admit: 2017-08-21 | Discharge: 2017-08-21 | Disposition: A | Discharge status: Home / Self Care | Payer: 59 | Source: Ambulatory Visit | Attending: Obstetrics and Gynecology | Admitting: Obstetrics and Gynecology

## 2017-08-21 ENCOUNTER — Other Ambulatory Visit: Payer: Self-pay | Admitting: Obstetrics and Gynecology

## 2017-08-21 DIAGNOSIS — N6310 Unspecified lump in the right breast, unspecified quadrant: Secondary | ICD-10-CM

## 2017-08-21 DIAGNOSIS — R928 Other abnormal and inconclusive findings on diagnostic imaging of breast: Secondary | ICD-10-CM

## 2018-02-18 ENCOUNTER — Inpatient Hospital Stay: Admission: RE | Admit: 2018-02-18 | Payer: 59 | Source: Ambulatory Visit

## 2018-08-10 IMAGING — CR DG CHEST 2V
2 series · 2 of 2 positions shown · non-contrast
Comparison: 05/09/2013

CLINICAL DATA: Cough and chest tightness for 2 weeks

EXAM:
CHEST  2 VIEW

[w chest pa]
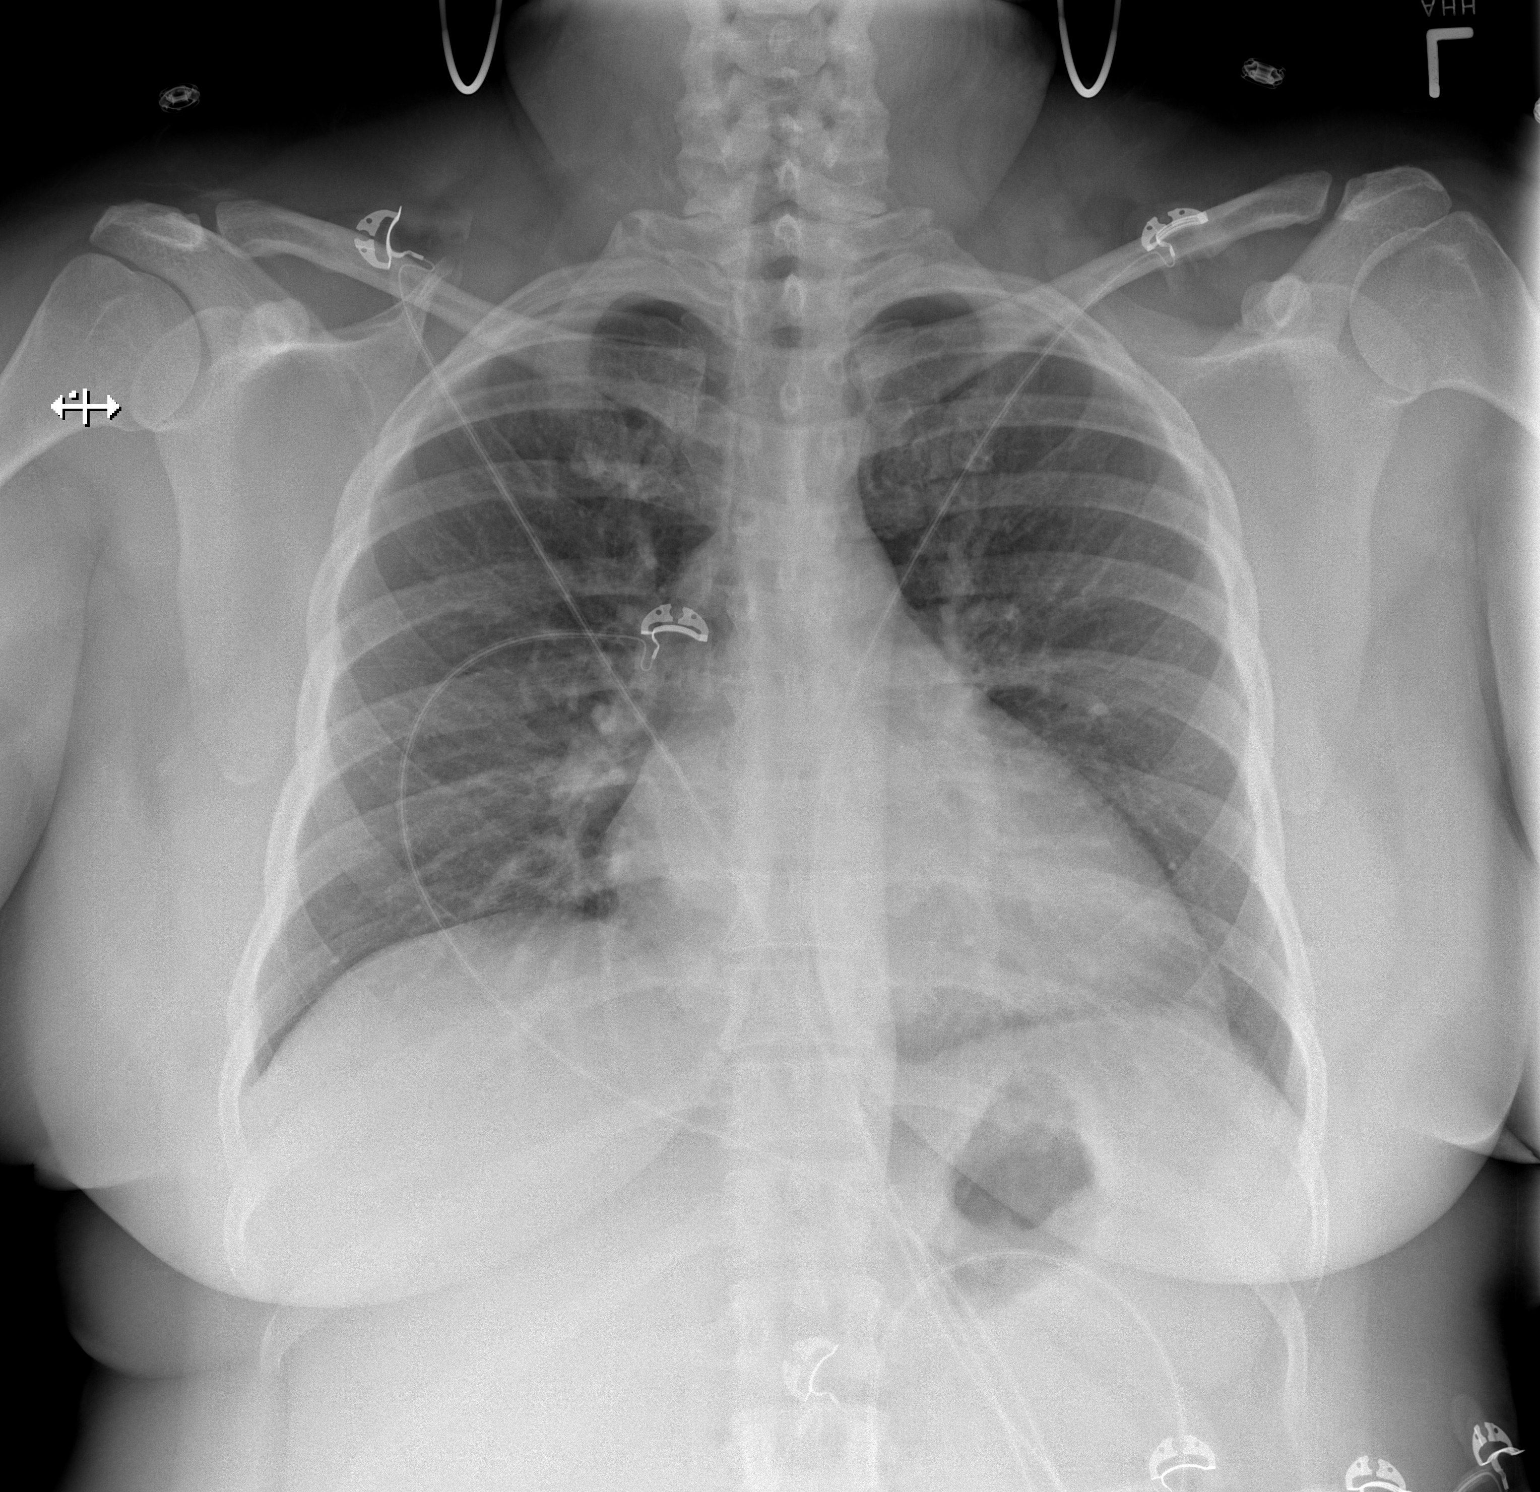

[w chest lat]
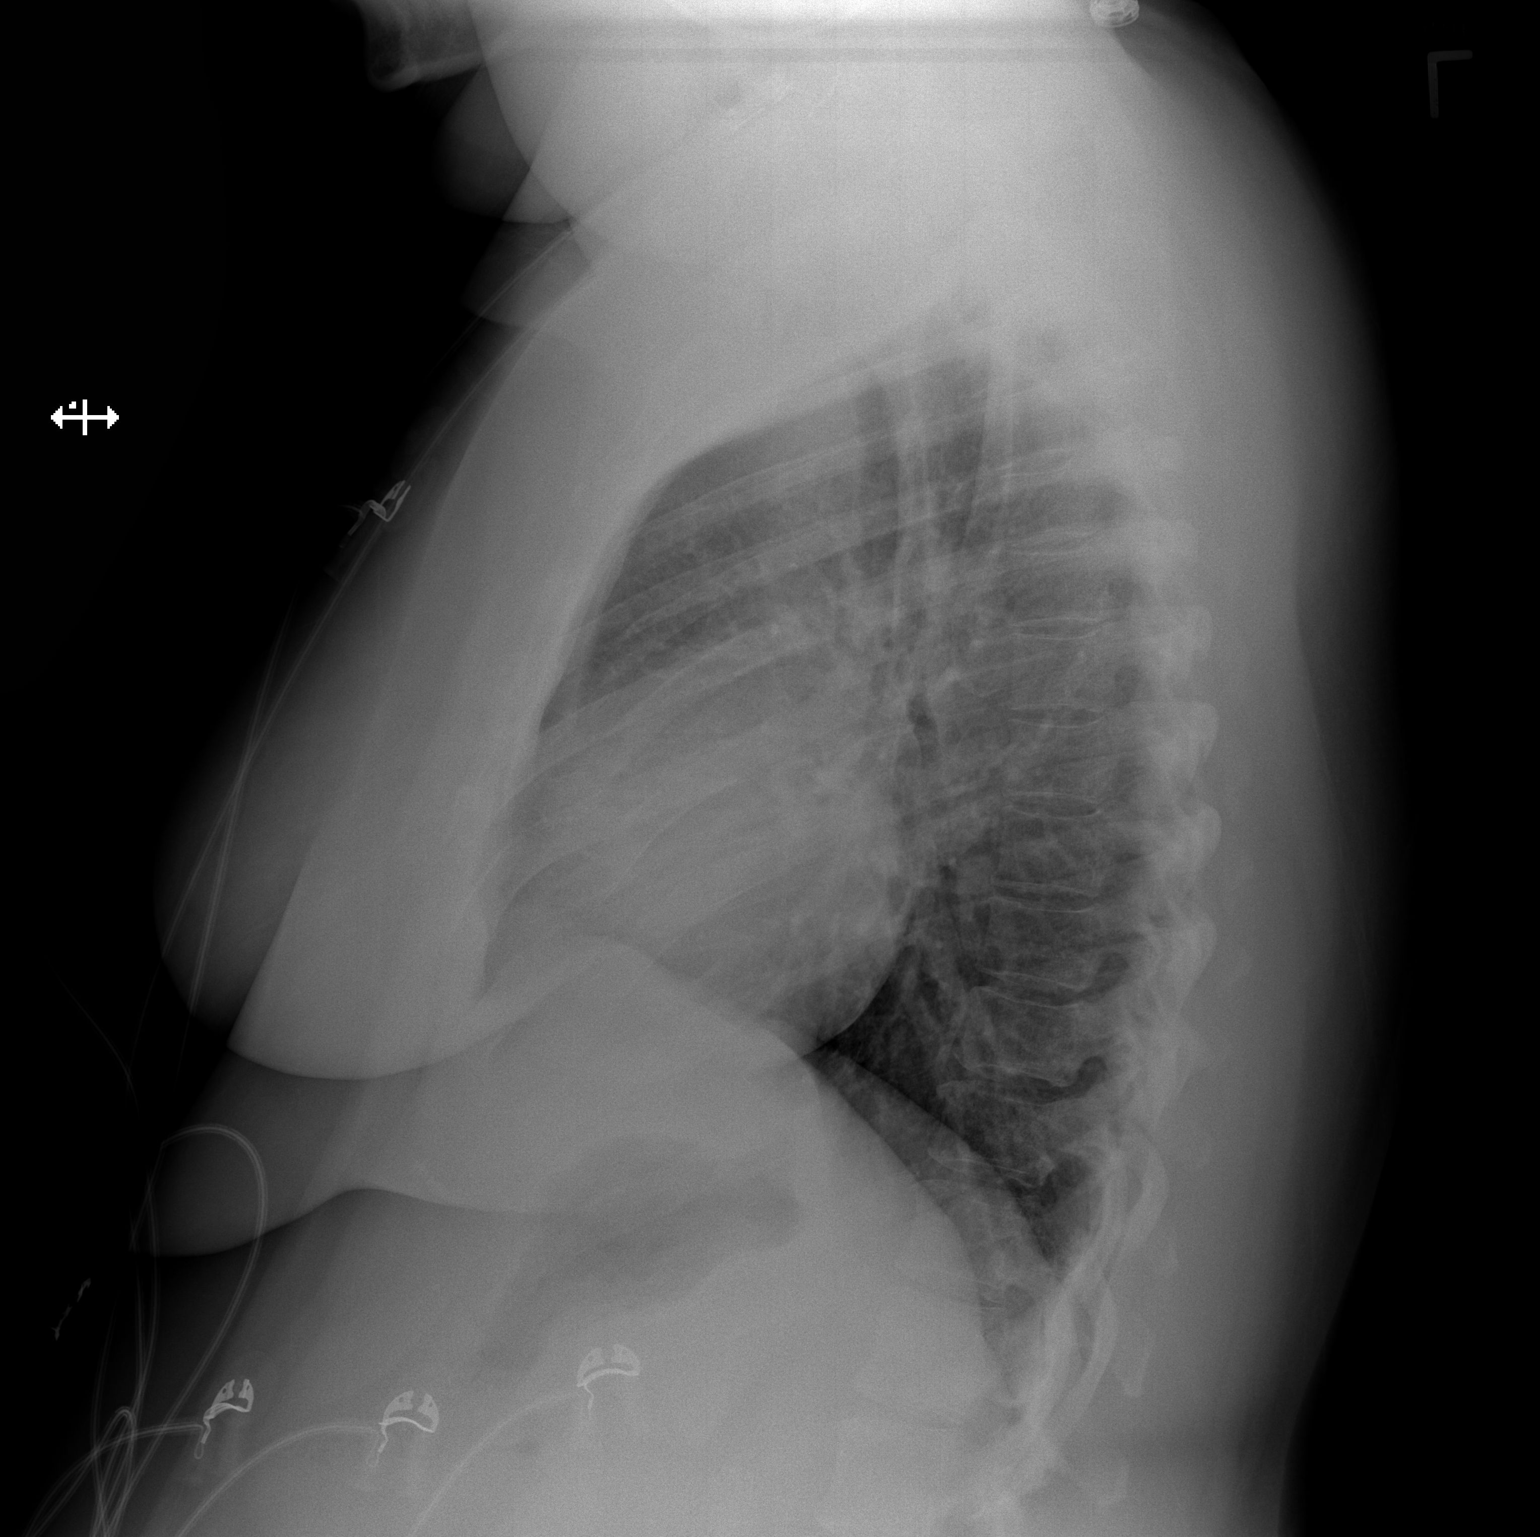

[2 of 2 positions shown; findings below may reference images not displayed]

FINDINGS: Cardiac shadow is mildly enlarged but stable. The lungs are well
aerated bilaterally. No focal infiltrate or sizable effusion is
seen. No acute bony abnormality is noted.
IMPRESSION: No active cardiopulmonary disease.

## 2019-09-29 ENCOUNTER — Emergency Department (HOSPITAL_COMMUNITY): Payer: 59

## 2019-09-29 ENCOUNTER — Other Ambulatory Visit: Payer: Self-pay

## 2019-09-29 ENCOUNTER — Encounter (HOSPITAL_COMMUNITY): Payer: Self-pay

## 2019-09-29 ENCOUNTER — Emergency Department (HOSPITAL_COMMUNITY)
Admission: EM | Admit: 2019-09-29 | Discharge: 2019-09-29 | Disposition: A | Discharge status: Home / Self Care | Payer: 59 | Attending: Emergency Medicine | Admitting: Emergency Medicine

## 2019-09-29 DIAGNOSIS — Z79899 Other long term (current) drug therapy: Secondary | ICD-10-CM | POA: Insufficient documentation

## 2019-09-29 DIAGNOSIS — I471 Supraventricular tachycardia: Secondary | ICD-10-CM | POA: Diagnosis not present

## 2019-09-29 DIAGNOSIS — R002 Palpitations: Secondary | ICD-10-CM | POA: Diagnosis present

## 2019-09-29 LAB — BASIC METABOLIC PANEL
Anion gap: 12 (ref 5–15)
BUN: 11 mg/dL (ref 6–20)
CO2: 22 mmol/L (ref 22–32)
Calcium: 8.9 mg/dL (ref 8.9–10.3)
Chloride: 102 mmol/L (ref 98–111)
Creatinine, Ser: 0.74 mg/dL (ref 0.44–1.00)
GFR calc Af Amer: >60 mL/min (ref >60)
GFR calc non Af Amer: >60 mL/min (ref >60)
Glucose, Bld: 89 mg/dL (ref 70–99)
Potassium: 3.4 mmol/L — ABNORMAL LOW (ref 3.5–5.1)
Sodium: 136 mmol/L (ref 135–145)

## 2019-09-29 LAB — HEPATIC FUNCTION PANEL
ALT: 20 U/L (ref 0–44)
AST: 16 U/L (ref 15–41)
Albumin: 3.9 g/dL (ref 3.5–5.0)
Alkaline Phosphatase: 56 U/L (ref 38–126)
Bilirubin, Direct: 0.1 mg/dL (ref 0.0–0.2)
Indirect Bilirubin: 0.3 mg/dL (ref 0.3–0.9)
Total Bilirubin: 0.4 mg/dL (ref 0.3–1.2)
Total Protein: 8.3 g/dL — ABNORMAL HIGH (ref 6.5–8.1)

## 2019-09-29 LAB — CBC
HCT: 35 % — ABNORMAL LOW (ref 36.0–46.0)
Hemoglobin: 10.4 g/dL — ABNORMAL LOW (ref 12.0–15.0)
MCH: 23.2 pg — ABNORMAL LOW (ref 26.0–34.0)
MCHC: 29.7 g/dL — ABNORMAL LOW (ref 30.0–36.0)
MCV: 78.1 fL — ABNORMAL LOW (ref 80.0–100.0)
Platelets: 396 10*3/uL (ref 150–400)
RBC: 4.48 MIL/uL (ref 3.87–5.11)
RDW: 15.5 % (ref 11.5–15.5)
WBC: 13 10*3/uL — ABNORMAL HIGH (ref 4.0–10.5)
nRBC: 0 % (ref 0.0–0.2)

## 2019-09-29 LAB — TROPONIN I (HIGH SENSITIVITY): Troponin I (High Sensitivity): <2 ng/L (ref <18)

## 2019-09-29 MED ORDER — SODIUM CHLORIDE 0.9% FLUSH: 3.0000 mL | Freq: Once | INTRAVENOUS | Status: DC

## 2019-09-29 MED ORDER — ADENOSINE 6 MG/2ML IV SOLN
6.0000 mg | Freq: Once | INTRAVENOUS | Status: AC
  Administered 2019-09-29: 6 mg via INTRAVENOUS

## 2019-09-29 MED ORDER — ADENOSINE 6 MG/2ML IV SOLN
6.0000 mg | Freq: Once | INTRAVENOUS | Status: DC
  Filled 2019-09-29: qty 2

## 2019-09-29 NOTE — ED Provider Notes (Signed)
[place] DEPT Provider Note   CSN: [id number] Arrival date & time: 09/29/19  1853     History Chief Complaint  Patient presents with  . Palpitations    Marie Wang is a 44 y.o. female.  Patient presents with palpitations.  This is happened before and she is going to see her cardiologist on Friday  The history is provided by the patient. No language interpreter was used.  Palpitations Palpitations quality:  Regular Onset quality:  Sudden Timing:  Constant Progression:  Unchanged Chronicity:  New Context: anxiety   Relieved by:  Nothing Worsened by:  Nothing Ineffective treatments:  None tried Associated symptoms: back pain   Associated symptoms: no chest pain and no cough   Risk factors: no diabetes mellitus        Past Medical History:  Diagnosis Date  . Anxiety   . Cervical insufficiency during pregnancy in second trimester, antepartum 03/22/2015  . Chlamydia 2009  . Fibroid   . Hx gestational diabetes   . Leiomyoma 2013   small  . Miscarriage   . Polycystic ovaries   . Postpartum care following vaginal delivery (12/18) 06/19/2015  . Vaginal bleeding during pregnancy, antepartum 01/12/2015    Patient Active Problem List   Diagnosis Date Noted  . Normal labor 06/19/2015  . Postpartum care following vaginal delivery (12/18) 06/19/2015  . Cervical insufficiency during pregnancy in second trimester, antepartum 03/22/2015  . Cervical incompetence affecting management of pregnancy in second trimester, antepartum 03/22/2015  . Vaginal bleeding during pregnancy, antepartum 01/12/2015  . [redacted] weeks gestation of pregnancy   . Hx gestational diabetes     Past Surgical History:  Procedure Laterality Date  . MOUTH SURGERY  1996     OB History    Gravida  5   Para  2   Term  1   Preterm  1   AB  3   Living  2     SAB  2   TAB  1   Ectopic      Multiple  0   Live Births  2           Family History    Problem Relation Age of Onset  . Hypertension Mother   . Breast cancer Paternal Aunt 71  . Ovarian cancer Maternal Grandmother   . Diabetes Paternal Grandmother   . Hypertension Father        borderline  . Diabetes Father     Social History   Tobacco Use  . Smoking status: Never Smoker  . Smokeless tobacco: Never Used  Substance Use Topics  . Alcohol use: Yes    Comment: socially, not while pregnant  . Drug use: No    Home Medications Prior to Admission medications   Medication Sig Start Date End Date Taking? Authorizing Provider  albuterol (PROVENTIL HFA;VENTOLIN HFA) 108 (90 BASE) MCG/ACT inhaler Inhale 2 puffs into the lungs every 6 (six) hours as needed for wheezing or shortness of breath.    [provider]  ALPRAZolam Duanne Moron) 0.25 MG tablet Take 1 tablet by mouth daily as needed for anxiety 02/02/17   [provider]  benzonatate (TESSALON) 100 MG capsule Take 1 capsule (100 mg total) by mouth 3 (three) times daily as needed for cough. 03/11/17   Duffy Bruce, MD  dextromethorphan-guaiFENesin [facility] DM) 30-600 MG 12hr tablet Take 1 tablet by mouth 2 (two) times daily as needed for cough.    [provider]  doxycycline (  VIBRAMYCIN) 100 MG capsule Take 1 capsule (100 mg total) by mouth 2 (two) times daily. 03/11/17   Duffy Bruce, MD  escitalopram (LEXAPRO) 20 MG tablet Take 20 mg by mouth at bedtime.     [provider]  HYDROcodone-homatropine (HYCODAN) 5-1.5 MG/5ML syrup Take 5 mLs by mouth every 6 (six) hours as needed for cough. 03/11/17   Duffy Bruce, MD  ibuprofen (ADVIL,MOTRIN) 600 MG tablet Take 1 tablet (600 mg total) by mouth every 6 (six) hours. 06/21/15   Artelia Laroche, CNM  metFORMIN (GLUCOPHAGE-XR) 500 MG 24 hr tablet Take 500 mg by mouth at bedtime.     [provider]  Prenatal Vit-Fe Fumarate-FA (PRENATAL MULTIVITAMIN) TABS tablet Take 1 tablet by mouth at bedtime.    [provider]   norethindrone-ethinyl estradiol (JUNEL FE 1/20) 1-20 MG-MCG per tablet Take 1 tablet by mouth daily.    09/11/11  [provider]    Allergies    Codeine, Sulfa antibiotics, Ciprofloxacin, and Prednisone  Review of Systems   Review of Systems  Constitutional: Negative for appetite change and fatigue.  HENT: Negative for congestion, ear discharge and sinus pressure.   Eyes: Negative for discharge.  Respiratory: Negative for cough.   Cardiovascular: Positive for palpitations. Negative for chest pain.  Gastrointestinal: Negative for abdominal pain and diarrhea.  Genitourinary: Negative for frequency and hematuria.  Musculoskeletal: Positive for back pain.  Skin: Negative for rash.  Neurological: Negative for seizures and headaches.  Psychiatric/Behavioral: Negative for hallucinations.    Physical Exam Updated Vital Signs BP (!) 154/93   Pulse 95   Temp 98.3 F (36.8 C) (Oral)   Resp (!) 24   Ht 5\' 4"  (1.626 m)   Wt 122.5 kg   LMP 09/28/2019   SpO2 100%   BMI 46.35 kg/m   Physical Exam Vitals reviewed.  Constitutional:      Appearance: She is well-developed.  HENT:     Head: Normocephalic.     Nose: Nose normal.  Eyes:     General: No scleral icterus.    Conjunctiva/sclera: Conjunctivae normal.  Neck:     Thyroid: No thyromegaly.  Cardiovascular:     Rate and Rhythm: Regular rhythm. Tachycardia present.     Heart sounds: No murmur. No friction rub. No gallop.   Pulmonary:     Breath sounds: No stridor. No wheezing or rales.  Chest:     Chest wall: No tenderness.  Abdominal:     General: There is no distension.     Tenderness: There is no abdominal tenderness. There is no rebound.  Musculoskeletal:        General: Normal range of motion.     Cervical back: Neck supple.  Lymphadenopathy:     Cervical: No cervical adenopathy.  Skin:    Findings: No erythema or rash.  Neurological:     Mental Status: She is alert and oriented to person, place, and  time.     Motor: No abnormal muscle tone.     Coordination: Coordination normal.  Psychiatric:        Behavior: Behavior normal.     ED Results / Procedures / Treatments   Labs (all labs ordered are listed, but only abnormal results are displayed) Labs Reviewed  BASIC METABOLIC PANEL - Abnormal; Notable for the following components:      Result Value   Potassium 3.4 (*)    All other components within normal limits  CBC - Abnormal; Notable for the following components:  WBC 13.0 (*)    Hemoglobin 10.4 (*)    HCT 35.0 (*)    MCV 78.1 (*)    MCH 23.2 (*)    MCHC 29.7 (*)    All other components within normal limits  HEPATIC FUNCTION PANEL - Abnormal; Notable for the following components:   Total Protein 8.3 (*)    All other components within normal limits  I-STAT BETA HCG BLOOD, ED ([facility], [facility], [facility] ONLY)  TROPONIN I (HIGH SENSITIVITY)  TROPONIN I (HIGH SENSITIVITY)    EKG None  Radiology DG Chest Port 1 View  Result Date: 09/29/2019 CLINICAL DATA:  Weakness EXAM: PORTABLE CHEST 1 VIEW COMPARISON:  03/11/2017 FINDINGS: No focal opacity or pleural effusion. Mild cardiomegaly with slight central congestion. Negative for pneumothorax. IMPRESSION: Borderline to mild cardiomegaly with slight central congestion. Electronically Signed   By: Donavan Foil M.D.   On: 09/29/2019 19:32    Procedures Procedures (including critical care time)  Medications Ordered in ED Medications  sodium chloride flush (NS) 0.9 % injection 3 mL (3 mLs Intravenous Not Given 09/29/19 1952)  adenosine (ADENOCARD) 6 MG/2ML injection 6 mg (6 mg Intravenous Given 09/29/19 1912)    ED Course  I have reviewed the triage vital signs and the nursing notes.  Pertinent labs & imaging results that were available during my care of the patient were reviewed by me and considered in my medical decision making (see chart for details). CRITICAL CARE Performed by: Milton Ferguson Total critical care time: 45  minutes Critical care time was exclusive of separately billable procedures and treating other patients. Critical care was necessary to treat or prevent imminent or life-threatening deterioration. Critical care was time spent personally by me on the following activities: development of treatment plan with patient and/or surrogate as well as nursing, discussions with consultants, evaluation of patient's response to treatment, examination of patient, obtaining history from patient or surrogate, ordering and performing treatments and interventions, ordering and review of laboratory studies, ordering and review of radiographic studies, pulse oximetry and re-evaluation of patient's condition.    MDM Rules/Calculators/A&P                     Patient was in SVT.  She was given adenosine 6 mg and converted into a normal sinus rhythm.  Labs show her to be mildly anemic and also mildly hypokalemic.  Patient is discharged home with her lab results and EKGs and she will see the cardiologist on Friday  Final Clinical Impression(s) / ED Diagnoses Final diagnoses:  SVT (supraventricular tachycardia) [facility])    Rx / DC Orders ED Discharge Orders    None       Milton Ferguson, MD 09/29/19 2059

## 2019-09-29 NOTE — ED Triage Notes (Signed)
Patient arrived POV.   Patient reports not sleeping well last night and went to work like normal.  Patient got off work and went to get food and started to have palpitations and wheezing.    A/Ox4 Ambulatory in triage.

## 2019-09-29 NOTE — ED Notes (Signed)
An After Visit Summary was printed and given to the patient. Discharge instructions given and no further questions at this time.  

## 2019-09-29 NOTE — Discharge Instructions (Addendum)
Follow up with your cardiologist appointment this week.  Show the doctor the tests we did

## 2019-10-01 NOTE — Progress Notes (Signed)
Cardiology Office Note:    Date:  10/03/2019   ID:  Marie Wang, DOB 01-29-1976, MRN [id number]  PCP:  Alessandra Grout, MD (Inactive)  Cardiologist:  Donato Heinz, MD  Electrophysiologist:  None   Referring MD: Lujean Amel, MD   Chief Complaint  Patient presents with  . Palpitations    History of Present Illness:    Marie Wang is a 44 y.o. female with a hx of asthma, anxiety, prediabetes who is referred by Dr. Dorthy Cooler for evaluation of palpitations.  Presented with palpitations to [facility] ED on 09/29/2019.  EKG showed SVT with HR 165, she was given adenosine 6 mg and converted to normal sinus rhythm.  She reports that she has had 5 episodes of SVT in the past 5 years.  She went to the ED the first time it happened, but rhythm broke without intervention.  Typically last 15 to 20 minutes and resolves spontaneously.  However she recently had 2 episodes in 2 weeks.  Episode 09/29/2019 lasted longer than usual, prompting her to go to the ED.  Broke with adenosine in the ED.  States that she felt very fatigued during the episode, but denies any lightheadedness, syncope, chest pain, or dyspnea.  She denies any caffeine intake, reports that she stopped drinking tea a few months ago.  Drinks about 4 alcoholic drinks per week.  No smoking history.  No history of heart disease in immediate family.  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  Denies any exertional chest pain or dyspnea.      Past Medical History:  Diagnosis Date  . Anxiety   . Cervical insufficiency during pregnancy in second trimester, antepartum 03/22/2015  . Chlamydia 2009  . Fibroid   . Hx gestational diabetes   . Leiomyoma 2013   small  . Miscarriage   . Polycystic ovaries   . Postpartum care following vaginal delivery (12/18) 06/19/2015  . Vaginal bleeding during pregnancy, antepartum 01/12/2015    Past Surgical History:  Procedure Laterality Date  .  MOUTH SURGERY  1996    Current Medications: Current Meds  Medication Sig  . albuterol (PROVENTIL HFA;VENTOLIN HFA) 108 (90 BASE) MCG/ACT inhaler Inhale 2 puffs into the lungs every 6 (six) hours as needed for wheezing or shortness of breath.  . escitalopram (LEXAPRO) 20 MG tablet Take 20 mg by mouth at bedtime.   Marland Kitchen ESTARYLLA 0.25-35 MG-MCG tablet Take 1 tablet by mouth daily.  Marland Kitchen ibuprofen (ADVIL,MOTRIN) 600 MG tablet Take 1 tablet (600 mg total) by mouth every 6 (six) hours.  . metroNIDAZOLE (METROGEL) 0.75 % vaginal gel SMARTSIG:1 Applicator Vaginal Every Night PRN  . Multiple Vitamins-Minerals (MULTIVITAL PO) Take by mouth. Women's one A day  . omeprazole (PRILOSEC) 20 MG capsule Take 20 mg by mouth daily.  Marland Kitchen VITAMIN D PO Take 1,000 mg by mouth.     Allergies:   Codeine, Sulfa antibiotics, Ciprofloxacin, and Prednisone   Social History   Socioeconomic History  . Marital status: Single    Spouse name: Not on file  . Number of children: Not on file  . Years of education: Not on file  . Highest education level: Not on file  Occupational History  . Not on file  Tobacco Use  . Smoking status: Never Smoker  . Smokeless tobacco: Never Used  Substance and Sexual Activity  . Alcohol use: Yes    Comment: socially, not while pregnant  .  Drug use: No  . Sexual activity: Yes    Partners: Male    Birth control/protection: None    Comment: one week ago last sex  Other Topics Concern  . Not on file  Social History Narrative  . Not on file   Social Determinants of Health   Financial Resource Strain:   . Difficulty of Paying Living Expenses:   Food Insecurity:   . Worried About Charity fundraiser in the Last Year:   . Arboriculturist in the Last Year:   Transportation Needs:   . Film/video editor (Medical):   Marland Kitchen Lack of Transportation (Non-Medical):   Physical Activity:   . Days of Exercise per Week:   . Minutes of Exercise per Session:   Stress:   . Feeling of Stress :    Social Connections:   . Frequency of Communication with Friends and Family:   . Frequency of Social Gatherings with Friends and Family:   . Attends Religious Services:   . Active Member of Clubs or Organizations:   . Attends Archivist Meetings:   Marland Kitchen Marital Status:      Family History: The patient's family history includes Breast cancer (age of onset: 57) in her paternal aunt; Diabetes in her father and paternal grandmother; Hypertension in her father and mother; Ovarian cancer in her maternal grandmother.  ROS:   Please see the history of present illness.     All other systems reviewed and are negative.  EKGs/Labs/Other Studies Reviewed:    The following studies were reviewed today:   EKG:  EKG is  ordered today.  The ekg ordered today demonstrates normal sinus rhythm, rate 81, no ST/T abnormality  EKG from ED 09/29/19: SVT, rate 165  Recent Labs: 09/29/2019: ALT 20; BUN 11; Creatinine, Ser 0.74; Hemoglobin 10.4; Platelets 396; Potassium 3.4; Sodium 136  Recent Lipid Panel    Component Value Date/Time   CHOL 181 10/13/2012 1557   TRIG 149 10/13/2012 1557   HDL 62 10/13/2012 1557   CHOLHDL 2.9 10/13/2012 1557   VLDL 30 10/13/2012 1557   LDLCALC 89 10/13/2012 1557    Physical Exam:    VS:  BP 134/86   Pulse 81   Ht 5\' 4"  (1.626 m)   Wt 269 lb 12.8 oz (122.4 kg)   LMP 09/28/2019   SpO2 98%   Breastfeeding No   BMI 46.31 kg/m     Wt Readings from Last 3 Encounters:  10/02/19 269 lb 12.8 oz (122.4 kg)  09/29/19 270 lb (122.5 kg)  03/11/17 260 lb (117.9 kg)     GEN: Well nourished, well developed in no acute distress HEENT: Normal NECK: No JVD; No carotid bruits LYMPHATICS: No lymphadenopathy CARDIAC: RRR, no murmurs, rubs, gallops RESPIRATORY:  Clear to auscultation without rales, wheezing or rhonchi  ABDOMEN: Soft, non-tender, non-distended MUSCULOSKELETAL:  No edema; No deformity  SKIN: Warm and dry NEUROLOGIC:  Alert and oriented x  3 PSYCHIATRIC:  Normal affect   ASSESSMENT:    1. SVT (supraventricular tachycardia) (HCC)   2. Palpitations   3. Prediabetes    PLAN:    SVT: Has had 5 episodes in 5 years, recently had 2 episodes in 2 weeks, and second episode required adenosine in the ED to break.  Given increased frequency and worsening symptoms, recommend treatment -Start metoprolol 25 mg twice daily -TSH normal on 06/21/19.  Recommended avoiding triggers, specifically caffeine and alcohol  Prediabetes: A1c 6.1 on 07/01/2019  RTC  in 3 months  Medication Adjustments/Labs and Tests Ordered: Current medicines are reviewed at length with the patient today.  Concerns regarding medicines are outlined above.  Orders Placed This Encounter  Procedures  . EKG 12-Lead  . ECHOCARDIOGRAM COMPLETE   Meds ordered this encounter  Medications  . metoprolol tartrate (LOPRESSOR) 25 MG tablet    Sig: Take 1 tablet (25 mg total) by mouth 2 (two) times daily.    Dispense:  180 tablet    Refill:  3    Patient Instructions  Medication Instructions:    start Metoprolol tartrate 25 mg  One tablet twice a day    *If you need a refill on your cardiac medications before your next appointment, please call your pharmacy*   Lab Work: Not needed   Testing/Procedures: Will be schedule  At [place] has requested that you have an echocardiogram. Echocardiography is a painless test that uses sound waves to create images of your heart. It provides your doctor with information about the size and shape of your heart and how well your heart's chambers and valves are working. This procedure takes approximately one hour. There are no restrictions for this procedure.     Follow-Up: At [facility], you and your health needs are our priority.  As part of our continuing mission to provide you with exceptional heart care, we have created designated Provider Care Teams.  These Care Teams include  your primary Cardiologist (physician) and Advanced Practice Providers (APPs -  Physician Assistants and Nurse Practitioners) who all work together to provide you with the care you need, when you need it.    Your next appointment:   3 month(s)  The format for your next appointment:   In Person  Provider:   Oswaldo Milian, MD      Signed, Donato Heinz, MD  10/03/2019 12:30 AM    [place]

## 2019-10-02 ENCOUNTER — Ambulatory Visit (INDEPENDENT_AMBULATORY_CARE_PROVIDER_SITE_OTHER): Payer: 59 | Admitting: Cardiology

## 2019-10-02 ENCOUNTER — Other Ambulatory Visit: Payer: Self-pay

## 2019-10-02 ENCOUNTER — Encounter: Payer: Self-pay | Admitting: Cardiology

## 2019-10-02 VITALS — BP 134/86 | HR 81 | Ht 64.0 in | Wt 269.8 lb

## 2019-10-02 DIAGNOSIS — R002 Palpitations: Secondary | ICD-10-CM

## 2019-10-02 DIAGNOSIS — I471 Supraventricular tachycardia: Secondary | ICD-10-CM

## 2019-10-02 DIAGNOSIS — R7303 Prediabetes: Secondary | ICD-10-CM | POA: Diagnosis not present

## 2019-10-02 MED ORDER — METOPROLOL TARTRATE 25 MG PO TABS: 25.0000 mg | ORAL_TABLET | Freq: Two times a day (BID) | ORAL | 3 refills | Status: DC

## 2019-10-02 NOTE — Patient Instructions (Addendum)
Medication Instructions:    start Metoprolol tartrate 25 mg  One tablet twice a day    *If you need a refill on your cardiac medications before your next appointment, please call your pharmacy*   Lab Work: Not needed   Testing/Procedures: Will be schedule  At [place] has requested that you have an echocardiogram. Echocardiography is a painless test that uses sound waves to create images of your heart. It provides your doctor with information about the size and shape of your heart and how well your heart's chambers and valves are working. This procedure takes approximately one hour. There are no restrictions for this procedure.     Follow-Up: At [facility], you and your health needs are our priority.  As part of our continuing mission to provide you with exceptional heart care, we have created designated Provider Care Teams.  These Care Teams include your primary Cardiologist (physician) and Advanced Practice Providers (APPs -  Physician Assistants and Nurse Practitioners) who all work together to provide you with the care you need, when you need it.    Your next appointment:   3 month(s)  The format for your next appointment:   In Person  Provider:   Oswaldo Milian, MD

## 2019-10-12 ENCOUNTER — Other Ambulatory Visit (HOSPITAL_COMMUNITY): Payer: 59

## 2019-10-21 ENCOUNTER — Emergency Department (HOSPITAL_BASED_OUTPATIENT_CLINIC_OR_DEPARTMENT_OTHER): Payer: Managed Care, Other (non HMO)

## 2019-10-21 ENCOUNTER — Other Ambulatory Visit: Payer: Self-pay

## 2019-10-21 ENCOUNTER — Emergency Department (HOSPITAL_COMMUNITY): Payer: Managed Care, Other (non HMO)

## 2019-10-21 ENCOUNTER — Emergency Department (HOSPITAL_COMMUNITY)
Admission: EM | Admit: 2019-10-21 | Discharge: 2019-10-21 | Disposition: A | Discharge status: Home / Self Care | Payer: Managed Care, Other (non HMO) | Attending: Emergency Medicine | Admitting: Emergency Medicine

## 2019-10-21 ENCOUNTER — Encounter (HOSPITAL_COMMUNITY): Payer: Self-pay | Admitting: Emergency Medicine

## 2019-10-21 DIAGNOSIS — M79609 Pain in unspecified limb: Secondary | ICD-10-CM | POA: Diagnosis not present

## 2019-10-21 DIAGNOSIS — U071 COVID-19: Secondary | ICD-10-CM

## 2019-10-21 DIAGNOSIS — Z79899 Other long term (current) drug therapy: Secondary | ICD-10-CM | POA: Diagnosis not present

## 2019-10-21 DIAGNOSIS — R0789 Other chest pain: Secondary | ICD-10-CM | POA: Diagnosis present

## 2019-10-21 DIAGNOSIS — M79604 Pain in right leg: Secondary | ICD-10-CM | POA: Diagnosis not present

## 2019-10-21 DIAGNOSIS — M79605 Pain in left leg: Secondary | ICD-10-CM | POA: Insufficient documentation

## 2019-10-21 LAB — CBC WITH DIFFERENTIAL/PLATELET
Abs Immature Granulocytes: 0.03 10*3/uL (ref 0.00–0.07)
Basophils Absolute: 0 10*3/uL (ref 0.0–0.1)
Basophils Relative: 0 %
Eosinophils Absolute: 0 10*3/uL (ref 0.0–0.5)
Eosinophils Relative: 0 %
HCT: 38.1 % (ref 36.0–46.0)
Hemoglobin: 11.4 g/dL — ABNORMAL LOW (ref 12.0–15.0)
Immature Granulocytes: 1 %
Lymphocytes Relative: 34 %
Lymphs Abs: 1.9 10*3/uL (ref 0.7–4.0)
MCH: 22.9 pg — ABNORMAL LOW (ref 26.0–34.0)
MCHC: 29.9 g/dL — ABNORMAL LOW (ref 30.0–36.0)
MCV: 76.7 fL — ABNORMAL LOW (ref 80.0–100.0)
Monocytes Absolute: 0.4 10*3/uL (ref 0.1–1.0)
Monocytes Relative: 7 %
Neutro Abs: 3.3 10*3/uL (ref 1.7–7.7)
Neutrophils Relative %: 58 %
Platelets: 325 10*3/uL (ref 150–400)
RBC: 4.97 MIL/uL (ref 3.87–5.11)
RDW: 16.5 % — ABNORMAL HIGH (ref 11.5–15.5)
WBC: 5.7 10*3/uL (ref 4.0–10.5)
nRBC: 0 % (ref 0.0–0.2)

## 2019-10-21 LAB — BASIC METABOLIC PANEL
Anion gap: 11 (ref 5–15)
BUN: 8 mg/dL (ref 6–20)
CO2: 24 mmol/L (ref 22–32)
Calcium: 8.6 mg/dL — ABNORMAL LOW (ref 8.9–10.3)
Chloride: 99 mmol/L (ref 98–111)
Creatinine, Ser: 0.81 mg/dL (ref 0.44–1.00)
GFR calc Af Amer: >60 mL/min (ref >60)
GFR calc non Af Amer: >60 mL/min (ref >60)
Glucose, Bld: 100 mg/dL — ABNORMAL HIGH (ref 70–99)
Potassium: 4.4 mmol/L (ref 3.5–5.1)
Sodium: 134 mmol/L — ABNORMAL LOW (ref 135–145)

## 2019-10-21 LAB — HEPATIC FUNCTION PANEL
ALT: 39 U/L (ref 0–44)
AST: 52 U/L — ABNORMAL HIGH (ref 15–41)
Albumin: 3.4 g/dL — ABNORMAL LOW (ref 3.5–5.0)
Alkaline Phosphatase: 100 U/L (ref 38–126)
Bilirubin, Direct: 0.2 mg/dL (ref 0.0–0.2)
Indirect Bilirubin: 0.3 mg/dL (ref 0.3–0.9)
Total Bilirubin: 0.5 mg/dL (ref 0.3–1.2)
Total Protein: 8.4 g/dL — ABNORMAL HIGH (ref 6.5–8.1)

## 2019-10-21 LAB — I-STAT BETA HCG BLOOD, ED (MC, WL, AP ONLY): I-stat hCG, quantitative: <5 m[IU]/mL (ref <5)

## 2019-10-21 MED ORDER — SODIUM CHLORIDE 0.9 % IV BOLUS
500.0000 mL | Freq: Once | INTRAVENOUS | Status: AC
  Administered 2019-10-21: 16:00:00 500 mL via INTRAVENOUS

## 2019-10-21 MED ORDER — ACETAMINOPHEN 500 MG PO TABS
1000.0000 mg | ORAL_TABLET | Freq: Once | ORAL | Status: AC
  Administered 2019-10-21: 1000 mg via ORAL
  Filled 2019-10-21: qty 2

## 2019-10-21 MED ORDER — IOHEXOL 350 MG/ML SOLN
100.0000 mL | Freq: Once | INTRAVENOUS | Status: AC | PRN
  Administered 2019-10-21: 19:00:00 100 mL via INTRAVENOUS

## 2019-10-21 NOTE — ED Notes (Signed)
Pt transported to CT ?

## 2019-10-21 NOTE — ED Provider Notes (Signed)
[facility] DEPT Provider Note   CSN: [id number] Arrival date & time: 10/21/19  1432     History Chief Complaint  Patient presents with  . Leg Pain  . Chest Pain  . Covid positive    RIDHI ALMONTE is a 44 y.o. female.  The history is provided by the patient.  Chest Pain Pain location:  Substernal area Pain quality: aching   Pain radiates to:  Does not radiate Pain severity:  Mild Onset quality:  Gradual Timing:  Intermittent Progression:  Waxing and waning Chronicity:  New Context: breathing   Relieved by:  Nothing Worsened by:  Nothing Associated symptoms: cough, fever and shortness of breath   Associated symptoms: no abdominal pain, no back pain, no palpitations and no vomiting   Risk factors comment:  COVID dx last week      Past Medical History:  Diagnosis Date  . Anxiety   . Cervical insufficiency during pregnancy in second trimester, antepartum 03/22/2015  . Chlamydia 2009  . Fibroid   . Hx gestational diabetes   . Leiomyoma 2013   small  . Miscarriage   . Polycystic ovaries   . Postpartum care following vaginal delivery (12/18) 06/19/2015  . Vaginal bleeding during pregnancy, antepartum 01/12/2015    Patient Active Problem List   Diagnosis Date Noted  . Normal labor 06/19/2015  . Postpartum care following vaginal delivery (12/18) 06/19/2015  . Cervical insufficiency during pregnancy in second trimester, antepartum 03/22/2015  . Cervical incompetence affecting management of pregnancy in second trimester, antepartum 03/22/2015  . Vaginal bleeding during pregnancy, antepartum 01/12/2015  . [redacted] weeks gestation of pregnancy   . Hx gestational diabetes     Past Surgical History:  Procedure Laterality Date  . MOUTH SURGERY  1996     OB History    Gravida  5   Para  2   Term  1   Preterm  1   AB  3   Living  2     SAB  2   TAB  1   Ectopic      Multiple  0   Live Births  2           Family  History  Problem Relation Age of Onset  . Hypertension Mother   . Breast cancer Paternal Aunt 27  . Ovarian cancer Maternal Grandmother   . Diabetes Paternal Grandmother   . Hypertension Father        borderline  . Diabetes Father     Social History   Tobacco Use  . Smoking status: Never Smoker  . Smokeless tobacco: Never Used  Substance Use Topics  . Alcohol use: Yes    Comment: socially, not while pregnant  . Drug use: No    Home Medications Prior to Admission medications   Medication Sig Start Date End Date Taking? Authorizing Provider  albuterol (PROVENTIL HFA;VENTOLIN HFA) 108 (90 BASE) MCG/ACT inhaler Inhale 2 puffs into the lungs every 6 (six) hours as needed for wheezing or shortness of breath.   Yes [provider]  azithromycin (ZITHROMAX) 250 MG tablet Take 250 mg by mouth as directed. 10/16/19  Yes [provider]  escitalopram (LEXAPRO) 20 MG tablet Take 20 mg by mouth at bedtime.    Yes [provider]  ESTARYLLA 0.25-35 MG-MCG tablet Take 1 tablet by mouth daily. 09/21/19  Yes [provider]  metoprolol tartrate (LOPRESSOR) 25 MG tablet Take 1 tablet (25 mg total) by mouth  2 (two) times daily. 10/02/19 12/31/19 Yes Donato Heinz, MD  Multiple Vitamins-Minerals (MULTIVITAL PO) Take by mouth. Women's one A day   Yes [provider]  omeprazole (PRILOSEC) 20 MG capsule Take 20 mg by mouth daily.   Yes [provider]  VITAMIN D PO Take 1,000 mg by mouth.   Yes [provider]  ibuprofen (ADVIL,MOTRIN) 600 MG tablet Take 1 tablet (600 mg total) by mouth every 6 (six) hours. Patient not taking: Reported on 10/21/2019 06/21/15   Artelia Laroche, CNM  norethindrone-ethinyl estradiol (JUNEL FE 1/20) 1-20 MG-MCG per tablet Take 1 tablet by mouth daily.    09/11/11  [provider]    Allergies    Codeine, Sulfa antibiotics, Ciprofloxacin, and Prednisone  Review of Systems   Review of Systems    Constitutional: Positive for fever. Negative for chills.  HENT: Negative for ear pain and sore throat.   Eyes: Negative for pain and visual disturbance.  Respiratory: Positive for cough and shortness of breath.   Cardiovascular: Positive for chest pain. Negative for palpitations.  Gastrointestinal: Negative for abdominal pain and vomiting.  Genitourinary: Negative for dysuria and hematuria.  Musculoskeletal: Positive for arthralgias (b/l lower leg pain). Negative for back pain.  Skin: Negative for color change and rash.  Neurological: Negative for seizures and syncope.  All other systems reviewed and are negative.   Physical Exam Updated Vital Signs  ED Triage Vitals  Enc Vitals Group     BP 10/21/19 1445 (!) 158/88     Pulse Rate 10/21/19 1445 94     Resp 10/21/19 1445 20     Temp 10/21/19 1445 (!) 101.8 F (38.8 C)     Temp Source 10/21/19 1445 Oral     SpO2 10/21/19 1445 98 %     Weight --      Height --      Head Circumference --      Peak Flow --      Pain Score 10/21/19 1446 7     Pain Loc --      Pain Edu? --      Excl. in [place]? --     Physical Exam Vitals and nursing note reviewed.  Constitutional:      General: She is not in acute distress.    Appearance: She is well-developed. She is not ill-appearing.  HENT:     Head: Normocephalic and atraumatic.  Eyes:     Extraocular Movements: Extraocular movements intact.     Conjunctiva/sclera: Conjunctivae normal.     Pupils: Pupils are equal, round, and reactive to light.  Cardiovascular:     Rate and Rhythm: Normal rate and regular rhythm.     Pulses:          Radial pulses are 2+ on the right side and 2+ on the left side.       Dorsalis pedis pulses are 2+ on the right side and 2+ on the left side.     Heart sounds: Normal heart sounds. No murmur.  Pulmonary:     Effort: Pulmonary effort is normal. No tachypnea or respiratory distress.     Breath sounds: Decreased breath sounds present. No wheezing or  rhonchi.  Abdominal:     Palpations: Abdomen is soft.     Tenderness: There is no abdominal tenderness.  Musculoskeletal:        General: Normal range of motion.     Cervical back: Normal range of motion and neck supple.  Right lower leg: No edema.     Left lower leg: No edema.  Skin:    General: Skin is warm and dry.     Capillary Refill: Capillary refill takes less than 2 seconds.  Neurological:     General: No focal deficit present.     Mental Status: She is alert.     ED Results / Procedures / Treatments   Labs (all labs ordered are listed, but only abnormal results are displayed) Labs Reviewed  CBC WITH DIFFERENTIAL/PLATELET - Abnormal; Notable for the following components:      Result Value   Hemoglobin 11.4 (*)    MCV 76.7 (*)    MCH 22.9 (*)    MCHC 29.9 (*)    RDW 16.5 (*)    All other components within normal limits  BASIC METABOLIC PANEL - Abnormal; Notable for the following components:   Sodium 134 (*)    Glucose, Bld 100 (*)    Calcium 8.6 (*)    All other components within normal limits  HEPATIC FUNCTION PANEL - Abnormal; Notable for the following components:   Total Protein 8.4 (*)    Albumin 3.4 (*)    AST 52 (*)    All other components within normal limits  I-STAT BETA HCG BLOOD, ED ([facility], [facility], [facility] ONLY)    EKG None  Radiology CT Angio Chest PE W and/or Wo Contrast  Result Date: 10/21/2019 CLINICAL DATA:  Shortness of breath EXAM: CT ANGIOGRAPHY CHEST WITH CONTRAST TECHNIQUE: Multidetector CT imaging of the chest was performed using the standard protocol during bolus administration of intravenous contrast. Multiplanar CT image reconstructions and MIPs were obtained to evaluate the vascular anatomy. CONTRAST:  119mL OMNIPAQUE IOHEXOL 350 MG/ML SOLN COMPARISON:  None. FINDINGS: Cardiovascular: Heart is normal size. Aorta is normal caliber. No filling defects in the pulmonary arteries to suggest pulmonary emboli. Mediastinum/Nodes: Small mediastinal and  right hilar lymph nodes, none pathologically enlarged. No axillary adenopathy. Lungs/Pleura: Patchy bilateral ground-glass airspace disease in both lungs most compatible with multifocal pneumonia, likely COVID pneumonia. No effusions. Upper Abdomen: Imaging into the upper abdomen shows no acute findings. Musculoskeletal: Chest wall soft tissues are unremarkable. No acute bony abnormality. Review of the MIP images confirms the above findings. IMPRESSION: Patchy bilateral ground-glass airspace disease compatible with multifocal pneumonia, likely COVID pneumonia. No evidence of pulmonary embolus. Electronically Signed   By: Rolm Baptise M.D.   On: 10/21/2019 19:06   DG Chest Portable 1 View  Result Date: 10/21/2019 CLINICAL DATA:  Shortness of breath, COVID-19 positive EXAM: PORTABLE CHEST 1 VIEW COMPARISON:  09/29/2019 FINDINGS: The heart size and mediastinal contours are stable. Low lung volumes. No focal airspace consolidation, pleural effusion, or pneumothorax. The visualized skeletal structures are unremarkable. IMPRESSION: No active disease. Electronically Signed   By: Davina Poke D.O.   On: 10/21/2019 16:20   VAS [place] LOWER EXTREMITY VENOUS (DVT) (ONLY [facility] & [facility])  Result Date: 10/21/2019  Lower Venous DVTStudy Indications: Calf pain and + Covid.  Limitations: Body habitus. Performing Technologist: June Leap RDMS, RVT  Examination Guidelines: A complete evaluation includes B-mode imaging, spectral Doppler, color Doppler, and power Doppler as needed of all accessible portions of each vessel. Bilateral testing is considered an integral part of a complete examination. Limited examinations for reoccurring indications may be performed as noted. The reflux portion of the exam is performed with the patient in reverse Trendelenburg.  +---------+---------------+---------+-----------+----------+--------------+ RIGHT    CompressibilityPhasicitySpontaneityPropertiesThrombus Aging  +---------+---------------+---------+-----------+----------+--------------+ CFV  Full           Yes      Yes                                 +---------+---------------+---------+-----------+----------+--------------+ SFJ      Full                                                        +---------+---------------+---------+-----------+----------+--------------+ FV Prox  Full                                                        +---------+---------------+---------+-----------+----------+--------------+ FV Mid   Full                                                        +---------+---------------+---------+-----------+----------+--------------+ FV DistalFull                                                        +---------+---------------+---------+-----------+----------+--------------+ PFV      Full                                                        +---------+---------------+---------+-----------+----------+--------------+ POP      Full           Yes      Yes                                 +---------+---------------+---------+-----------+----------+--------------+ PTV      Full                                                        +---------+---------------+---------+-----------+----------+--------------+ PERO     Full                                                        +---------+---------------+---------+-----------+----------+--------------+   Right Technical Findings: not all segments well visualized  +---------+---------------+---------+-----------+----------+--------------+ LEFT     CompressibilityPhasicitySpontaneityPropertiesThrombus Aging +---------+---------------+---------+-----------+----------+--------------+ CFV      Full           Yes      Yes                                 +---------+---------------+---------+-----------+----------+--------------+  SFJ      Full                                                         +---------+---------------+---------+-----------+----------+--------------+ FV Prox  Full                                                        +---------+---------------+---------+-----------+----------+--------------+ FV Mid   Full                                                        +---------+---------------+---------+-----------+----------+--------------+ FV DistalFull                                                        +---------+---------------+---------+-----------+----------+--------------+ PFV      Full                                                        +---------+---------------+---------+-----------+----------+--------------+ POP      Full           Yes      Yes                                 +---------+---------------+---------+-----------+----------+--------------+ PTV      Full                                                        +---------+---------------+---------+-----------+----------+--------------+ PERO     Full                                                        +---------+---------------+---------+-----------+----------+--------------+   Left Technical Findings: not all segments well visualized   Summary: RIGHT: - There is no evidence of deep vein thrombosis in the lower extremity. However, portions of this examination were limited- see technologist comments above.  LEFT: - There is no evidence of deep vein thrombosis in the lower extremity. However, portions of this examination were limited- see technologist comments above.  *See table(s) above for measurements and observations.    Preliminary     Procedures Procedures (including critical care time)  Medications Ordered in ED Medications  acetaminophen (TYLENOL) tablet 1,000 mg (1,000 mg Oral Given 10/21/19 1609)  sodium chloride 0.9 % bolus 500 mL (  0 mLs Intravenous Stopped 10/21/19 1803)  iohexol (OMNIPAQUE) 350 MG/ML injection 100 mL (100 mLs Intravenous  Contrast Given 10/21/19 1835)    ED Course  I have reviewed the triage vital signs and the nursing notes.  Pertinent labs & imaging results that were available during my care of the patient were reviewed by me and considered in my medical decision making (see chart for details).    MDM Rules/Calculators/A&P                      SHAWNEEQUA VICTORIA is a 44 year old female with no significant medical history presents to the ED with shortness of breath, leg pain in the setting of diagnosis of coronavirus last week.  Was sent to rule out blood clots given her symptoms by her PCP.  Patient febrile but otherwise unremarkable vitals.  Normal work of breathing.  Has had bilateral calf pain intermittent chest pain during this time.  States that she feels dehydrated.  No history of blood clots.  Will check lab work including DVT study, PE study.  Overall she appears comfortable.  Will give small fluid bolus and Tylenol for fever and dehydration.  Will check electrolytes.  DVT study negative.  PE study negative for PE.  There are some changes consistent with COVID-19 infection.  Otherwise no significant anemia, electrolyte abnormality, kidney injury.  Patient overall comfortable on reevaluation.  No hypoxia, no tachycardia.  Given return precautions and discharged from the ED in good condition.  This chart was dictated using voice recognition software.  Despite best efforts to proofread,  errors can occur which can change the documentation meaning.  NEYA NYHOLM was evaluated in Emergency Department on 10/21/2019 for the symptoms described in the history of present illness. She was evaluated in the context of the global COVID-19 pandemic, which necessitated consideration that the patient might be at risk for infection with the SARS-CoV-2 virus that causes COVID-19. Institutional protocols and algorithms that pertain to the evaluation of patients at risk for COVID-19 are in a state of rapid change based on  information released by regulatory bodies including the CDC and federal and state organizations. These policies and algorithms were followed during the patient's care in the ED.    Final Clinical Impression(s) / ED Diagnoses Final diagnoses:  [id number]    Rx / DC Orders ED Discharge Orders    None       Lennice Sites, DO 10/21/19 1921

## 2019-10-21 NOTE — Discharge Instructions (Addendum)
CT scan does not show any blood clot.  Does show some inflammation changes consistent with COVID-19.  Please return to the ED if symptoms worsen as discussed.  Continue Tylenol for fever.

## 2019-10-21 NOTE — ED Triage Notes (Signed)
Pt reports tested Covid+ last Friday, symptoms started last Wed. Reports had chest pains and bilat calf pains, doctor advised to rule out blood clots.

## 2019-10-21 NOTE — Progress Notes (Signed)
Lower venous duplex       has been completed. Preliminary results can be found under CV proc through chart review. Ladaija Dimino, BS, RDMS, RVT   

## 2019-10-26 ENCOUNTER — Other Ambulatory Visit (HOSPITAL_COMMUNITY): Payer: 59

## 2019-10-26 ENCOUNTER — Emergency Department (HOSPITAL_COMMUNITY): Payer: Managed Care, Other (non HMO)

## 2019-10-26 ENCOUNTER — Emergency Department (HOSPITAL_COMMUNITY)
Admission: EM | Admit: 2019-10-26 | Discharge: 2019-10-26 | Disposition: A | Discharge status: Home / Self Care | Payer: Managed Care, Other (non HMO) | Attending: Emergency Medicine | Admitting: Emergency Medicine

## 2019-10-26 ENCOUNTER — Other Ambulatory Visit: Payer: Self-pay

## 2019-10-26 DIAGNOSIS — R0602 Shortness of breath: Secondary | ICD-10-CM | POA: Diagnosis present

## 2019-10-26 DIAGNOSIS — E860 Dehydration: Secondary | ICD-10-CM | POA: Insufficient documentation

## 2019-10-26 DIAGNOSIS — Z79899 Other long term (current) drug therapy: Secondary | ICD-10-CM | POA: Diagnosis not present

## 2019-10-26 DIAGNOSIS — U071 COVID-19: Secondary | ICD-10-CM | POA: Diagnosis not present

## 2019-10-26 LAB — COMPREHENSIVE METABOLIC PANEL
ALT: 63 U/L — ABNORMAL HIGH (ref 0–44)
AST: 75 U/L — ABNORMAL HIGH (ref 15–41)
Albumin: 3.3 g/dL — ABNORMAL LOW (ref 3.5–5.0)
Alkaline Phosphatase: 124 U/L (ref 38–126)
Anion gap: 13 (ref 5–15)
BUN: 5 mg/dL — ABNORMAL LOW (ref 6–20)
CO2: 23 mmol/L (ref 22–32)
Calcium: 9.1 mg/dL (ref 8.9–10.3)
Chloride: 102 mmol/L (ref 98–111)
Creatinine, Ser: 0.82 mg/dL (ref 0.44–1.00)
GFR calc Af Amer: >60 mL/min (ref >60)
GFR calc non Af Amer: >60 mL/min (ref >60)
Glucose, Bld: 107 mg/dL — ABNORMAL HIGH (ref 70–99)
Potassium: 4.9 mmol/L (ref 3.5–5.1)
Sodium: 138 mmol/L (ref 135–145)
Total Bilirubin: 1 mg/dL (ref 0.3–1.2)
Total Protein: 8.2 g/dL — ABNORMAL HIGH (ref 6.5–8.1)

## 2019-10-26 LAB — CBC WITH DIFFERENTIAL/PLATELET
Abs Immature Granulocytes: 0.04 10*3/uL (ref 0.00–0.07)
Basophils Absolute: 0 10*3/uL (ref 0.0–0.1)
Basophils Relative: 1 %
Eosinophils Absolute: 0.1 10*3/uL (ref 0.0–0.5)
Eosinophils Relative: 1 %
HCT: 38.1 % (ref 36.0–46.0)
Hemoglobin: 11.4 g/dL — ABNORMAL LOW (ref 12.0–15.0)
Immature Granulocytes: 1 %
Lymphocytes Relative: 35 %
Lymphs Abs: 2.1 10*3/uL (ref 0.7–4.0)
MCH: 23.2 pg — ABNORMAL LOW (ref 26.0–34.0)
MCHC: 29.9 g/dL — ABNORMAL LOW (ref 30.0–36.0)
MCV: 77.6 fL — ABNORMAL LOW (ref 80.0–100.0)
Monocytes Absolute: 0.6 10*3/uL (ref 0.1–1.0)
Monocytes Relative: 10 %
Neutro Abs: 3.2 10*3/uL (ref 1.7–7.7)
Neutrophils Relative %: 52 %
Platelets: 377 10*3/uL (ref 150–400)
RBC: 4.91 MIL/uL (ref 3.87–5.11)
RDW: 16.9 % — ABNORMAL HIGH (ref 11.5–15.5)
WBC: 6 10*3/uL (ref 4.0–10.5)
nRBC: 0 % (ref 0.0–0.2)

## 2019-10-26 LAB — HCG, QUANTITATIVE, PREGNANCY: hCG, Beta Chain, Quant, S: <1 m[IU]/mL (ref <5)

## 2019-10-26 LAB — LIPASE, BLOOD: Lipase: 19 U/L (ref 11–51)

## 2019-10-26 MED ORDER — DEXAMETHASONE SODIUM PHOSPHATE 10 MG/ML IJ SOLN
10.0000 mg | Freq: Once | INTRAMUSCULAR | Status: AC
  Administered 2019-10-26: 10 mg via INTRAVENOUS
  Filled 2019-10-26: qty 1

## 2019-10-26 MED ORDER — LACTATED RINGERS IV BOLUS
2000.0000 mL | Freq: Once | INTRAVENOUS | Status: AC
  Administered 2019-10-26: 2000 mL via INTRAVENOUS

## 2019-10-26 NOTE — ED Notes (Signed)
An After Visit Summary was printed and given to the patient. Discharge instructions given and no further questions at this time. Per Dr. Zenia Resides, pt finishing IV fluids then leave.

## 2019-10-26 NOTE — ED Provider Notes (Signed)
[facility] DEPT Provider Note   CSN: [id number] Arrival date & time: 10/26/19  1132     History Chief Complaint  Patient presents with  . COVID+    Marie Wang is a 44 y.o. female.  44 year old female diagnosed with Covid about 12 days ago presents with vomiting and diarrhea.  She notes generalized malaise as well as some dyspnea on exertion.  Has had no appetite.  Was seen here few days ago for similar symptoms and had a Doppler of her legs as well as a chest CT which were all negative for clot.  She is not had any fevers.  But has noted weakness.  States that she can keep down Pedialyte at times.  Called her doctor due to her increased weakness who told to come here to be seen        Past Medical History:  Diagnosis Date  . Anxiety   . Cervical insufficiency during pregnancy in second trimester, antepartum 03/22/2015  . Chlamydia 2009  . Fibroid   . Hx gestational diabetes   . Leiomyoma 2013   small  . Miscarriage   . Polycystic ovaries   . Postpartum care following vaginal delivery (12/18) 06/19/2015  . Vaginal bleeding during pregnancy, antepartum 01/12/2015    Patient Active Problem List   Diagnosis Date Noted  . Normal labor 06/19/2015  . Postpartum care following vaginal delivery (12/18) 06/19/2015  . Cervical insufficiency during pregnancy in second trimester, antepartum 03/22/2015  . Cervical incompetence affecting management of pregnancy in second trimester, antepartum 03/22/2015  . Vaginal bleeding during pregnancy, antepartum 01/12/2015  . [redacted] weeks gestation of pregnancy   . Hx gestational diabetes     Past Surgical History:  Procedure Laterality Date  . MOUTH SURGERY  1996     OB History    Gravida  5   Para  2   Term  1   Preterm  1   AB  3   Living  2     SAB  2   TAB  1   Ectopic      Multiple  0   Live Births  2           Family History  Problem Relation Age of Onset  . Hypertension  Mother   . Breast cancer Paternal Aunt 63  . Ovarian cancer Maternal Grandmother   . Diabetes Paternal Grandmother   . Hypertension Father        borderline  . Diabetes Father     Social History   Tobacco Use  . Smoking status: Never Smoker  . Smokeless tobacco: Never Used  Substance Use Topics  . Alcohol use: Yes    Comment: socially, not while pregnant  . Drug use: No    Home Medications Prior to Admission medications   Medication Sig Start Date End Date Taking? Authorizing Provider  albuterol (PROVENTIL HFA;VENTOLIN HFA) 108 (90 BASE) MCG/ACT inhaler Inhale 2 puffs into the lungs every 6 (six) hours as needed for wheezing or shortness of breath.    [provider]  azithromycin (ZITHROMAX) 250 MG tablet Take 250 mg by mouth as directed. 10/16/19   [provider]  escitalopram (LEXAPRO) 20 MG tablet Take 20 mg by mouth at bedtime.     [provider]  ESTARYLLA 0.25-35 MG-MCG tablet Take 1 tablet by mouth daily. 09/21/19   [provider]  ibuprofen (ADVIL,MOTRIN) 600 MG tablet Take 1 tablet (600 mg total) by mouth  every 6 (six) hours. Patient not taking: Reported on 10/21/2019 06/21/15   Artelia Laroche, CNM  metoprolol tartrate (LOPRESSOR) 25 MG tablet Take 1 tablet (25 mg total) by mouth 2 (two) times daily. 10/02/19 12/31/19  Donato Heinz, MD  Multiple Vitamins-Minerals (MULTIVITAL PO) Take by mouth. Women's one A day    [provider]  omeprazole (PRILOSEC) 20 MG capsule Take 20 mg by mouth daily.    [provider]  VITAMIN D PO Take 1,000 mg by mouth.    [provider]  norethindrone-ethinyl estradiol (JUNEL FE 1/20) 1-20 MG-MCG per tablet Take 1 tablet by mouth daily.    09/11/11  [provider]    Allergies    Codeine, Sulfa antibiotics, Ciprofloxacin, and Prednisone  Review of Systems   Review of Systems  All other systems reviewed and are negative.   Physical Exam Updated Vital  Signs BP 127/83 (BP Location: Left Arm)   Pulse (!) 104   Temp 99.3 F (37.4 C) (Oral)   Resp 20   LMP 09/28/2019   SpO2 95%   Physical Exam Vitals and nursing note reviewed.  Constitutional:      General: She is not in acute distress.    Appearance: Normal appearance. She is well-developed. She is not toxic-appearing.  HENT:     Head: Normocephalic and atraumatic.  Eyes:     General: Lids are normal.     Conjunctiva/sclera: Conjunctivae normal.     Pupils: Pupils are equal, round, and reactive to light.  Neck:     Thyroid: No thyroid mass.     Trachea: No tracheal deviation.  Cardiovascular:     Rate and Rhythm: Normal rate and regular rhythm.     Heart sounds: Normal heart sounds. No murmur. No gallop.   Pulmonary:     Effort: Pulmonary effort is normal. No respiratory distress.     Breath sounds: Normal breath sounds. No stridor. No decreased breath sounds, wheezing, rhonchi or rales.  Abdominal:     General: Bowel sounds are normal. There is no distension.     Palpations: Abdomen is soft.     Tenderness: There is no abdominal tenderness. There is no rebound.  Musculoskeletal:        General: No tenderness. Normal range of motion.     Cervical back: Normal range of motion and neck supple.  Skin:    General: Skin is warm and dry.     Findings: No abrasion or rash.  Neurological:     Mental Status: She is alert and oriented to person, place, and time.     GCS: GCS eye subscore is 4. GCS verbal subscore is 5. GCS motor subscore is 6.     Cranial Nerves: No cranial nerve deficit.     Sensory: No sensory deficit.  Psychiatric:        Speech: Speech normal.        Behavior: Behavior normal.     ED Results / Procedures / Treatments   Labs (all labs ordered are listed, but only abnormal results are displayed) Labs Reviewed  CBC WITH DIFFERENTIAL/PLATELET  COMPREHENSIVE METABOLIC PANEL  LIPASE, BLOOD  HCG, QUANTITATIVE, PREGNANCY  URINALYSIS, ROUTINE W REFLEX  MICROSCOPIC    EKG None  Radiology No results found.  Procedures Procedures (including critical care time)  Medications Ordered in ED Medications  lactated ringers bolus 2,000 mL (has no administration in time range)    ED Course  I have reviewed the triage vital  signs and the nursing notes.  Pertinent labs & imaging results that were available during my care of the patient were reviewed by me and considered in my medical decision making (see chart for details).    MDM Rules/Calculators/A&P                      Patient given 2 L of lactated Ringer's here.  Repeat chest x-ray shows improvement from prior.  Decadron given 10 mg IV push.  Patient hemodynamically stable for discharge Final Clinical Impression(s) / ED Diagnoses Final diagnoses:  None    Rx / DC Orders ED Discharge Orders    None       Lacretia Leigh, MD 10/26/19 1452

## 2019-10-26 NOTE — ED Notes (Signed)
Pt aware UA is needed.

## 2019-10-26 NOTE — ED Notes (Signed)
ED Provider at bedside. 

## 2019-10-26 NOTE — ED Triage Notes (Signed)
Pt tested COVID+ 4/16, pt has been seen for COVID pnuemonia. Pt states she has slight SOB with exertion. Pt 97% RA while conversing with RN. VSS.

## 2019-11-18 ENCOUNTER — Ambulatory Visit (HOSPITAL_COMMUNITY): Payer: Managed Care, Other (non HMO) | Attending: Cardiovascular Disease

## 2019-11-18 ENCOUNTER — Other Ambulatory Visit: Payer: Self-pay

## 2019-11-18 DIAGNOSIS — I471 Supraventricular tachycardia: Secondary | ICD-10-CM | POA: Insufficient documentation

## 2019-12-02 ENCOUNTER — Telehealth: Payer: Self-pay | Admitting: *Deleted

## 2019-12-02 MED ORDER — METOPROLOL TARTRATE 25 MG PO TABS: 25.0000 mg | ORAL_TABLET | Freq: Two times a day (BID) | ORAL | 3 refills | Status: DC

## 2019-12-02 NOTE — Telephone Encounter (Signed)
Follow up ° ° °Patient is returning your call. Please call. ° ° ° °

## 2019-12-02 NOTE — Telephone Encounter (Signed)
Received notification from medical records-patient called requesting cardiac meds be sent to [facility].     lmtcb to clarify pharmacy

## 2019-12-02 NOTE — Telephone Encounter (Signed)
Spoke to patient, rx sent to [facility].  Patient aware.

## 2019-12-08 ENCOUNTER — Telehealth: Payer: Self-pay | Admitting: Cardiology

## 2019-12-08 NOTE — Telephone Encounter (Signed)
LMOM RE f/u appt

## 2019-12-11 ENCOUNTER — Telehealth: Payer: Self-pay | Admitting: Cardiology

## 2019-12-11 NOTE — Telephone Encounter (Signed)
I attempted to contact patient 12/11/19 to schedule follow up visit from patients recall list with Dr. Gardiner Rhyme. The patient didn't answer so I left message for patient to return call to get that appointment scheduled. I also informed patient of available appointments for Dr. Gardiner Rhyme on 12/15/19 at 10:40 am and 3:20 pm.

## 2019-12-31 ENCOUNTER — Telehealth: Payer: Self-pay | Admitting: *Deleted

## 2019-12-31 NOTE — Telephone Encounter (Signed)
Marie Wang, will call us to schedule her appointment once her Insurance start.

## 2020-01-03 ENCOUNTER — Other Ambulatory Visit: Payer: Self-pay

## 2020-01-03 ENCOUNTER — Emergency Department (HOSPITAL_COMMUNITY)
Admission: EM | Admit: 2020-01-03 | Discharge: 2020-01-03 | Disposition: A | Discharge status: Home / Self Care | Payer: Medicaid Other | Attending: Emergency Medicine | Admitting: Emergency Medicine

## 2020-01-03 DIAGNOSIS — R000 Tachycardia, unspecified: Secondary | ICD-10-CM | POA: Diagnosis not present

## 2020-01-03 DIAGNOSIS — R002 Palpitations: Secondary | ICD-10-CM | POA: Diagnosis not present

## 2020-01-03 DIAGNOSIS — I471 Supraventricular tachycardia: Secondary | ICD-10-CM

## 2020-01-03 LAB — BASIC METABOLIC PANEL
Anion gap: 13 (ref 5–15)
BUN: 12 mg/dL (ref 6–20)
CO2: 21 mmol/L — ABNORMAL LOW (ref 22–32)
Calcium: 8.5 mg/dL — ABNORMAL LOW (ref 8.9–10.3)
Chloride: 103 mmol/L (ref 98–111)
Creatinine, Ser: 0.78 mg/dL (ref 0.44–1.00)
GFR calc Af Amer: >60 mL/min (ref >60)
GFR calc non Af Amer: >60 mL/min (ref >60)
Glucose, Bld: 139 mg/dL — ABNORMAL HIGH (ref 70–99)
Potassium: 3.8 mmol/L (ref 3.5–5.1)
Sodium: 137 mmol/L (ref 135–145)

## 2020-01-03 LAB — CBC WITH DIFFERENTIAL/PLATELET
Abs Immature Granulocytes: 0.02 10*3/uL (ref 0.00–0.07)
Basophils Absolute: 0.1 10*3/uL (ref 0.0–0.1)
Basophils Relative: 1 %
Eosinophils Absolute: 0.2 10*3/uL (ref 0.0–0.5)
Eosinophils Relative: 2 %
HCT: 39 % (ref 36.0–46.0)
Hemoglobin: 12.4 g/dL (ref 12.0–15.0)
Immature Granulocytes: 0 %
Lymphocytes Relative: 37 %
Lymphs Abs: 3.3 10*3/uL (ref 0.7–4.0)
MCH: 26.6 pg (ref 26.0–34.0)
MCHC: 31.8 g/dL (ref 30.0–36.0)
MCV: 83.7 fL (ref 80.0–100.0)
Monocytes Absolute: 0.9 10*3/uL (ref 0.1–1.0)
Monocytes Relative: 10 %
Neutro Abs: 4.5 10*3/uL (ref 1.7–7.7)
Neutrophils Relative %: 50 %
Platelets: 277 10*3/uL (ref 150–400)
RBC: 4.66 MIL/uL (ref 3.87–5.11)
RDW: 17.9 % — ABNORMAL HIGH (ref 11.5–15.5)
WBC: 8.9 10*3/uL (ref 4.0–10.5)
nRBC: 0 % (ref 0.0–0.2)

## 2020-01-03 LAB — MAGNESIUM: Magnesium: 1.9 mg/dL (ref 1.7–2.4)

## 2020-01-03 MED ORDER — METOPROLOL TARTRATE 5 MG/5ML IV SOLN
5.0000 mg | Freq: Once | INTRAVENOUS | Status: AC
  Administered 2020-01-03: 5 mg via INTRAVENOUS
  Filled 2020-01-03: qty 5

## 2020-01-03 MED ORDER — SODIUM CHLORIDE 0.9 % IV SOLN: INTRAVENOUS | Status: DC

## 2020-01-03 NOTE — ED Triage Notes (Signed)
Per patient, she has history of SVT. Episode started around 0600 today and has not stopped. Patient endorses chest pain 5/10

## 2020-01-03 NOTE — ED Provider Notes (Signed)
[facility] DEPT Provider Note   CSN: [id number] Arrival date & time: 01/03/20  0741     History Chief Complaint  Patient presents with   Chest Pain   svt    Marie Wang is a 44 y.o. female.  44 year old female presents with increased heart rate.  Has a history of SVT and thinks she may have missed her normal daily dose of Lopressor.  Has had some chest palpitations with this.  Denies any diaphoresis or nausea or vomiting.  Does admit to having 1 alcoholic drink last evening.  Denies any caffeine use.  Took a dose of her metoprolol just before coming in today.        Past Medical History:  Diagnosis Date   Anxiety    Cervical insufficiency during pregnancy in second trimester, antepartum 03/22/2015   Chlamydia 2009   Fibroid    Hx gestational diabetes    Leiomyoma 2013   small   Miscarriage    Polycystic ovaries    Postpartum care following vaginal delivery (12/18) 06/19/2015   Vaginal bleeding during pregnancy, antepartum 01/12/2015    Patient Active Problem List   Diagnosis Date Noted   Normal labor 06/19/2015   Postpartum care following vaginal delivery (12/18) 06/19/2015   Cervical insufficiency during pregnancy in second trimester, antepartum 03/22/2015   Cervical incompetence affecting management of pregnancy in second trimester, antepartum 03/22/2015   Vaginal bleeding during pregnancy, antepartum 01/12/2015   [redacted] weeks gestation of pregnancy    Hx gestational diabetes     Past Surgical History:  Procedure Laterality Date   MOUTH SURGERY  1996     OB History    Gravida  5   Para  2   Term  1   Preterm  1   AB  3   Living  2     SAB  2   TAB  1   Ectopic      Multiple  0   Live Births  2           Family History  Problem Relation Age of Onset   Hypertension Mother    Breast cancer Paternal Aunt 32   Ovarian cancer Maternal Grandmother    Diabetes Paternal Grandmother      Hypertension Father        borderline   Diabetes Father     Social History   Tobacco Use   Smoking status: Never Smoker   Smokeless tobacco: Never Used  Vaping Use   Vaping Use: Never used  Substance Use Topics   Alcohol use: Yes    Comment: socially, not while pregnant   Drug use: No    Home Medications Prior to Admission medications   Medication Sig Start Date End Date Taking? Authorizing Provider  albuterol (PROVENTIL HFA;VENTOLIN HFA) 108 (90 BASE) MCG/ACT inhaler Inhale 2 puffs into the lungs every 6 (six) hours as needed for wheezing or shortness of breath.    [provider]  azithromycin (ZITHROMAX) 250 MG tablet Take 250 mg by mouth as directed. 10/16/19   [provider]  dexamethasone (DECADRON) 2 MG tablet  10/22/19   [provider]  escitalopram (LEXAPRO) 20 MG tablet Take 20 mg by mouth at bedtime.     [provider]  ESTARYLLA 0.25-35 MG-MCG tablet Take 1 tablet by mouth daily. 09/21/19   [provider]  ibuprofen (ADVIL,MOTRIN) 600 MG tablet Take 1 tablet (600 mg total) by mouth every 6 (six) hours.  Patient not taking: Reported on 10/21/2019 06/21/15   Artelia Laroche, CNM  metoprolol tartrate (LOPRESSOR) 25 MG tablet Take 1 tablet (25 mg total) by mouth 2 (two) times daily. 12/02/19 03/01/20  Donato Heinz, MD  Multiple Vitamins-Minerals (MULTIVITAL PO) Take by mouth. Women's one A day    [provider]  omeprazole (PRILOSEC) 20 MG capsule Take 20 mg by mouth daily.    [provider]  VITAMIN D PO Take 1,000 mg by mouth.    [provider]  norethindrone-ethinyl estradiol (JUNEL FE 1/20) 1-20 MG-MCG per tablet Take 1 tablet by mouth daily.    09/11/11  [provider]    Allergies    Codeine, Sulfa antibiotics, Ciprofloxacin, and Prednisone  Review of Systems   Review of Systems  All other systems reviewed and are negative.   Physical Exam Updated Vital  Signs BP (!) 169/114 (BP Location: Left Arm)    Pulse (!) 171    Temp 98.6 F (37 C) (Oral)    Resp 19    Ht 1.626 m (5\' 4" )    Wt 117.9 kg    SpO2 100%    BMI 44.63 kg/m   Physical Exam Vitals and nursing note reviewed.  Constitutional:      General: She is not in acute distress.    Appearance: Normal appearance. She is well-developed. She is not toxic-appearing.  HENT:     Head: Normocephalic and atraumatic.  Eyes:     General: Lids are normal.     Conjunctiva/sclera: Conjunctivae normal.     Pupils: Pupils are equal, round, and reactive to light.  Neck:     Thyroid: No thyroid mass.     Trachea: No tracheal deviation.  Cardiovascular:     Rate and Rhythm: Regular rhythm. Tachycardia present.     Heart sounds: Normal heart sounds. No murmur heard.  No gallop.   Pulmonary:     Effort: Pulmonary effort is normal. No respiratory distress.     Breath sounds: Normal breath sounds. No stridor. No decreased breath sounds, wheezing, rhonchi or rales.  Abdominal:     General: Bowel sounds are normal. There is no distension.     Palpations: Abdomen is soft.     Tenderness: There is no abdominal tenderness. There is no rebound.  Musculoskeletal:        General: No tenderness. Normal range of motion.     Cervical back: Normal range of motion and neck supple.  Skin:    General: Skin is warm and dry.     Findings: No abrasion or rash.  Neurological:     Mental Status: She is alert and oriented to person, place, and time.     GCS: GCS eye subscore is 4. GCS verbal subscore is 5. GCS motor subscore is 6.     Cranial Nerves: No cranial nerve deficit.     Sensory: No sensory deficit.  Psychiatric:        Speech: Speech normal.        Behavior: Behavior normal.     ED Results / Procedures / Treatments   Labs (all labs ordered are listed, but only abnormal results are displayed) Labs Reviewed  CBC WITH DIFFERENTIAL/PLATELET  BASIC METABOLIC PANEL  MAGNESIUM    EKG EKG  Interpretation  Date/Time:  Sunday January 03 2020 07:52:54 EDT Ventricular Rate:  167 PR Interval:    QRS Duration: 75 QT Interval:  295 QTC Calculation: 492 R Axis:   58 Text  Interpretation: Sinus tachycardia Repol abnrm suggests ischemia, inferior leads 12 Lead; Mason-Likar Confirmed by Lacretia Leigh ([id number]) on 01/03/2020 8:11:57 AM   Radiology No results found.  Procedures Procedures (including critical care time)  Medications Ordered in ED Medications  0.9 %  sodium chloride infusion (has no administration in time range)  metoprolol tartrate (LOPRESSOR) injection 5 mg (has no administration in time range)    ED Course  I have reviewed the triage vital signs and the nursing notes.  Pertinent labs & imaging results that were available during my care of the patient were reviewed by me and considered in my medical decision making (see chart for details).    MDM Rules/Calculators/A&P                          Patient given Lopressor 5 mg IV push here for heart rate 160.  Patient's heart rate came down into the 80s.  Labs are reviewed and are reassuring.  Will discharge home at this time.  CRITICAL CARE Performed by: Leota Jacobsen Total critical care time: 50 minutes Critical care time was exclusive of separately billable procedures and treating other patients. Critical care was necessary to treat or prevent imminent or life-threatening deterioration. Critical care was time spent personally by me on the following activities: development of treatment plan with patient and/or surrogate as well as nursing, discussions with consultants, evaluation of patient's response to treatment, examination of patient, obtaining history from patient or surrogate, ordering and performing treatments and interventions, ordering and review of laboratory studies, ordering and review of radiographic studies, pulse oximetry and re-evaluation of patient's condition.  Final Clinical Impression(s) / ED  Diagnoses Final diagnoses:  None    Rx / DC Orders ED Discharge Orders    None       Lacretia Leigh, MD 01/03/20 1015

## 2020-01-03 NOTE — ED Notes (Signed)
ekg completed and handed to Dr. Zenia Resides

## 2020-01-11 ENCOUNTER — Encounter: Payer: Self-pay | Admitting: General Practice

## 2020-02-29 NOTE — Progress Notes (Signed)
Cardiology Clinic Note   Patient Name: Marie Wang Date of Encounter: 03/01/2020  Primary Care Provider:  Alessandra Grout, MD (Inactive) Primary Cardiologist:  Donato Heinz, MD  Patient Profile    Marie Wang 44 year old female presents to the clinic today for a follow-up evaluation of her chest pain.  Past Medical History    Past Medical History:  Diagnosis Date  . Anxiety   . Cervical insufficiency during pregnancy in second trimester, antepartum 03/22/2015  . Chlamydia 2009  . Fibroid   . Hx gestational diabetes   . Leiomyoma 2013   small  . Miscarriage   . Polycystic ovaries   . Postpartum care following vaginal delivery (12/18) 06/19/2015  . Vaginal bleeding during pregnancy, antepartum 01/12/2015   Past Surgical History:  Procedure Laterality Date  . MOUTH SURGERY  1996    Allergies  Allergies  Allergen Reactions  . Codeine Anaphylaxis and Hives  . Sulfa Antibiotics Itching  . Ciprofloxacin Rash  . Prednisone Hives and Other (See Comments)    Pt states that she is able to tolerate the injection.      History of Present Illness    Marie Wang has a PMH of SVT, anxiety,, palpitations, asthma, and prediabetes.  She presents today was not long emergency department on 3/21 with an episode of SVT.  Her heart rate was 165.  She was given adenosine 6 mg and converted to normal sinus rhythm.  She reported that she had 5 episodes of SVT in the last 5 years.  She went to the emergency department the first time and her SVT converted without intervention.  She indicated that her SVT typically lasts 15-20 minutes and resolve spontaneously.  However, she had noted more recent episodes, having 2 episodes in the last 2 weeks.  They were also lasting longer than usual.  They cause her to have increased fatigue.  She denied lightheadedness, syncope, chest pain and dyspnea.  She was last seen by Dr. Gardiner Rhyme on 10/02/2019.  She denied caffeine intake and reported  that she had stopped drinking tea a few months prior.  She was consuming about 4 alcoholic drinks per week, did not smoke, has no history of heart disease in her immediate family.  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  She denied exertional chest pain and dyspnea.  She presented to the emergency department on 01/03/2020.  She indicated that she was having an increased heart rate and had missed her normal daily dose of Lopressor.  She denied diaphoresis, vomiting, nausea.  She indicated that she had 1 alcoholic drink the previous evening.  She denied caffeine use.  She indicated that she had taken a dose of her metoprolol just prior to coming to the ED.  Blood pressure was 169/114 with a heart rate of 171.  EKG showed sinus tachycardia repolarization abnormality suggesting ischemia in the inferior leads 167 bpm.  She was given Lopressor 5 mg IV push.  Her heart rate decreased to 80 bpm.  She was discharged in stable condition.  She presents the clinic today for follow-up evaluation and states she feels well today.  She has had 1 episode since July that happened approximately 2 weeks ago where she had accelerated heart rate.  She states that she did a vagal type maneuver which corrected her heart rate.  She has noticed that her heart is very sensitive to the metoprolol  medication and if she takes her medication late her heart rate will increase.  She continues to avoid triggers such as caffeine and has been limiting her alcohol.  She is excited to start a new job with less stress.  We discussed taking an extra dose of metoprolol for accelerated heart rate that do not respond to vagal maneuvers.  She expressed understanding.  I will have her follow-up with myself or Dr. Gardiner Rhyme in 6 months.  Today she denies chest pain, shortness of breath, lower extremity edema, fatigue, palpitations, melena, hematuria, hemoptysis, diaphoresis,  weakness, presyncope, syncope, orthopnea, and PND.   Home Medications    Prior to Admission medications   Medication Sig Start Date End Date Taking? Authorizing Provider  albuterol (PROVENTIL HFA;VENTOLIN HFA) 108 (90 BASE) MCG/ACT inhaler Inhale 2 puffs into the lungs every 6 (six) hours as needed for wheezing or shortness of breath.    [provider]  escitalopram (LEXAPRO) 20 MG tablet Take 20 mg by mouth at bedtime.     [provider]  ESTARYLLA 0.25-35 MG-MCG tablet Take 1 tablet by mouth daily. 09/21/19   [provider]  FEROSUL 325 (65 Fe) MG tablet Take 325 mg by mouth in the morning and at bedtime.  11/10/19   [provider]  metoprolol tartrate (LOPRESSOR) 25 MG tablet Take 1 tablet (25 mg total) by mouth 2 (two) times daily. 12/02/19 03/01/20  Donato Heinz, MD  Multiple Vitamins-Minerals (MULTIVITAL PO) Take 1 tablet by mouth daily. Women's one A day     [provider]  omeprazole (PRILOSEC) 20 MG capsule Take 20 mg by mouth daily.    [provider]  norethindrone-ethinyl estradiol (JUNEL FE 1/20) 1-20 MG-MCG per tablet Take 1 tablet by mouth daily.    09/11/11  [provider]    Family History    Family History  Problem Relation Age of Onset  . Hypertension Mother   . Breast cancer Paternal Aunt 78  . Ovarian cancer Maternal Grandmother   . Diabetes Paternal Grandmother   . Hypertension Father        borderline  . Diabetes Father    She indicated that the status of her mother is unknown. She indicated that the status of her father is unknown. She indicated that the status of her maternal grandmother is unknown. She indicated that the status of her paternal grandmother is unknown. She indicated that the status of her paternal aunt is unknown.  Social History    Social History   Socioeconomic History  . Marital status: Single    Spouse name: Not on file  . Number of children: Not on file  .  Years of education: Not on file  . Highest education level: Not on file  Occupational History  . Not on file  Tobacco Use  . Smoking status: Never Smoker  . Smokeless tobacco: Never Used  Vaping Use  . Vaping Use: Never used  Substance and Sexual Activity  . Alcohol use: Yes    Comment: socially, not while pregnant  . Drug use: No  . Sexual activity: Yes    Partners: Male    Birth control/protection: None    Comment: one week ago last sex  Other Topics Concern  . Not on file  Social History Narrative  . Not on file   Social Determinants of Health   Financial Resource Strain:   . Difficulty of Paying Living Expenses: Not on file  Food Insecurity:   .  Worried About Charity fundraiser in the Last Year: Not on file  . Ran Out of Food in the Last Year: Not on file  Transportation Needs:   . Lack of Transportation (Medical): Not on file  . Lack of Transportation (Non-Medical): Not on file  Physical Activity:   . Days of Exercise per Week: Not on file  . Minutes of Exercise per Session: Not on file  Stress:   . Feeling of Stress : Not on file  Social Connections:   . Frequency of Communication with Friends and Family: Not on file  . Frequency of Social Gatherings with Friends and Family: Not on file  . Attends Religious Services: Not on file  . Active Member of Clubs or Organizations: Not on file  . Attends Archivist Meetings: Not on file  . Marital Status: Not on file  Intimate Partner Violence:   . Fear of Current or Ex-Partner: Not on file  . Emotionally Abused: Not on file  . Physically Abused: Not on file  . Sexually Abused: Not on file     Review of Systems    General:  No chills, fever, night sweats or weight changes.  Cardiovascular:  No chest pain, dyspnea on exertion, edema, orthopnea, palpitations, paroxysmal nocturnal dyspnea. Dermatological: No rash, lesions/masses Respiratory: No cough, dyspnea Urologic: No hematuria, dysuria Abdominal:    No nausea, vomiting, diarrhea, bright red blood per rectum, melena, or hematemesis Neurologic:  No visual changes, wkns, changes in mental status. All other systems reviewed and are otherwise negative except as noted above.  Physical Exam    VS:  BP 126/88   Pulse 75   Ht 5\' 4"  (1.626 m)   Wt 274 lb (124.3 kg)   BMI 47.03 kg/m  , BMI Body mass index is 47.03 kg/m. GEN: Well nourished, well developed, in no acute distress. HEENT: normal. Neck: Supple, no JVD, carotid bruits, or masses. Cardiac: RRR, no murmurs, rubs, or gallops. No clubbing, cyanosis, edema.  Radials/DP/PT 2+ and equal bilaterally.  Respiratory:  Respirations regular and unlabored, clear to auscultation bilaterally. GI: Soft, nontender, nondistended, BS + x 4. MS: no deformity or atrophy. Skin: warm and dry, no rash. Neuro:  Strength and sensation are intact. Psych: Normal affect.  Accessory Clinical Findings    Recent Labs: 10/26/2019: ALT 63 01/03/2020: BUN 12; Creatinine, Ser 0.78; Hemoglobin 12.4; Magnesium 1.9; Platelets 277; Potassium 3.8; Sodium 137   Recent Lipid Panel    Component Value Date/Time   CHOL 181 10/13/2012 1557   TRIG 149 10/13/2012 1557   HDL 62 10/13/2012 1557   CHOLHDL 2.9 10/13/2012 1557   VLDL 30 10/13/2012 1557   LDLCALC 89 10/13/2012 1557    ECG personally reviewed by me today-normal sinus rhythm possible anterior infarct undetermined age 6 bpm- No acute changes  EKG 01/05/2020 Sinus tachycardia abnormal repolarization suggesting inferior ischemia 167 bpm  Echocardiogram 11/18/2019 IMPRESSIONS    1. Left ventricular ejection fraction, by estimation, is 60 to 65%. The  left ventricle has normal function. The left ventricle has no regional  wall motion abnormalities. There is mild left ventricular hypertrophy of  the posterior segment. Left  ventricular diastolic parameters were normal.  2. Right ventricular systolic function is normal. The right ventricular  size is  normal. There is normal pulmonary artery systolic pressure.  3. Left atrial size was moderately dilated.  4. The mitral valve is normal in structure. No evidence of mitral valve  regurgitation. No evidence of mitral  stenosis.  5. The aortic valve is tricuspid. Aortic valve regurgitation is not  visualized. No aortic stenosis is present.  6. The inferior vena cava is normal in size with greater than 50%  respiratory variability, suggesting right atrial pressure of 3 mmHg.  Assessment & Plan   1.  Supraventricular tachycardia-heart rate today 75.  Most recent episode 01/05/2020.  She had missed a dose of her metoprolol.  She received a dose of IV metoprolol in the emergency department which decreased her heart rate to the 80s.  She was discharged in stable condition. Continue metoprolol-May take an extra dose of metoprolol with accelerated heart rate greater than 120 and a blood pressure greater than 163 systolic Heart healthy low-sodium diet-salty 6 given Increase physical activity as tolerated Avoid triggers caffeine, chocolate, EtOH etc. Maintain p.o. hydration  Essential hypertension-BP today 126/88.  Well-controlled at home. Continue metoprolol Heart healthy low-sodium diet-salty 6 given Increase physical activity as tolerated  Prediabetes-glucose 139 on 01/03/2020. Heart healthy low-sodium carbohydrate modified diet Increase physical activity as tolerated Followed by PCP   Disposition: Follow-up with Dr. Gardiner Rhyme or me in 6 months.  Jossie Ng. Mavrick Mcquigg NP-C    03/01/2020, 11:28 AM [place] [address] Office [phone] Fax [phone]  Notice: This dictation was prepared with Dragon dictation along with smaller phrase technology. Any transcriptional errors that result from this process are unintentional and may not be corrected upon review.

## 2020-03-01 ENCOUNTER — Encounter: Payer: Self-pay | Admitting: General Practice

## 2020-03-01 ENCOUNTER — Other Ambulatory Visit: Payer: Self-pay

## 2020-03-01 ENCOUNTER — Ambulatory Visit (INDEPENDENT_AMBULATORY_CARE_PROVIDER_SITE_OTHER): Payer: BC Managed Care – PPO | Admitting: General Practice

## 2020-03-01 VITALS — BP 126/88 | HR 75 | Ht 64.0 in | Wt 274.0 lb

## 2020-03-01 DIAGNOSIS — I471 Supraventricular tachycardia: Secondary | ICD-10-CM

## 2020-03-01 DIAGNOSIS — I10 Essential (primary) hypertension: Secondary | ICD-10-CM

## 2020-03-01 DIAGNOSIS — R002 Palpitations: Secondary | ICD-10-CM

## 2020-03-01 DIAGNOSIS — R7303 Prediabetes: Secondary | ICD-10-CM

## 2020-03-01 NOTE — Patient Instructions (Signed)
Medication Instructions:  Continue current medications  *If you need a refill on your cardiac medications before your next appointment, please call your pharmacy*   Lab Work: None Ordered    Testing/Procedures: None Ordered   Follow-Up: At [facility], you and your health needs are our priority.  As part of our continuing mission to provide you with exceptional heart care, we have created designated Provider Care Teams.  These Care Teams include your primary Cardiologist (physician) and Advanced Practice Providers (APPs -  Physician Assistants and Nurse Practitioners) who all work together to provide you with the care you need, when you need it.  We recommend signing up for the patient portal called "MyChart".  Sign up information is provided on this After Visit Summary.  MyChart is used to connect with patients for Virtual Visits (Telemedicine).  Patients are able to view lab/test results, encounter notes, upcoming appointments, etc.  Non-urgent messages can be sent to your provider as well.   To learn more about what you can do with MyChart, go to [email or web address].    Your next appointment:   6 month(s)  The format for your next appointment:   In Person  Provider:   You may see Donato Heinz, MD or one of the following Advanced Practice Providers on your designated Care Team:    Rosaria Ferries, PA-C  Jory Sims, DNP, ANP  Cadence Kathlen Mody, PA-C    Other Instructions Ok to take extra dose of Metoprolol is you have a heart rate greater than 120 bpm Increase physical activities

## 2020-07-05 ENCOUNTER — Telehealth: Payer: Self-pay | Admitting: *Deleted

## 2020-07-05 NOTE — Telephone Encounter (Signed)
A message was left, re: her follow up visit. 

## 2020-10-13 ENCOUNTER — Other Ambulatory Visit: Payer: Self-pay | Admitting: Cardiology

## 2020-10-13 NOTE — Telephone Encounter (Signed)
This is Dr. Schumann's pt 

## 2020-11-11 ENCOUNTER — Emergency Department (HOSPITAL_COMMUNITY)
Admission: EM | Admit: 2020-11-11 | Discharge: 2020-11-11 | Disposition: A | Discharge status: Home / Self Care | Payer: Medicaid Other | Attending: Emergency Medicine | Admitting: Emergency Medicine

## 2020-11-11 ENCOUNTER — Other Ambulatory Visit: Payer: Self-pay

## 2020-11-11 ENCOUNTER — Encounter (HOSPITAL_COMMUNITY): Payer: Self-pay

## 2020-11-11 ENCOUNTER — Emergency Department (HOSPITAL_COMMUNITY): Payer: Medicaid Other

## 2020-11-11 DIAGNOSIS — Z20822 Contact with and (suspected) exposure to covid-19: Secondary | ICD-10-CM | POA: Insufficient documentation

## 2020-11-11 DIAGNOSIS — J069 Acute upper respiratory infection, unspecified: Secondary | ICD-10-CM

## 2020-11-11 DIAGNOSIS — R059 Cough, unspecified: Secondary | ICD-10-CM

## 2020-11-11 LAB — SARS CORONAVIRUS 2 (TAT 6-24 HRS): SARS Coronavirus 2: NEGATIVE

## 2020-11-11 MED ORDER — ALBUTEROL SULFATE HFA 108 (90 BASE) MCG/ACT IN AERS
2.0000 | INHALATION_SPRAY | RESPIRATORY_TRACT | Status: DC | PRN
  Administered 2020-11-11: 2 via RESPIRATORY_TRACT
  Filled 2020-11-11: qty 6.7

## 2020-11-11 MED ORDER — BENZONATATE 100 MG PO CAPS
100.0000 mg | ORAL_CAPSULE | Freq: Three times a day (TID) | ORAL | 0 refills | Status: DC
Start: 2020-11-11 — End: 2023-11-22

## 2020-11-11 MED ORDER — DEXAMETHASONE SODIUM PHOSPHATE 10 MG/ML IJ SOLN
10.0000 mg | Freq: Once | INTRAMUSCULAR | Status: AC
  Administered 2020-11-11: 10 mg via INTRAMUSCULAR
  Filled 2020-11-11: qty 1

## 2020-11-11 MED ORDER — AEROCHAMBER Z-STAT PLUS/MEDIUM MISC
1.0000 | Freq: Once | Status: AC
  Administered 2020-11-11: 1
  Filled 2020-11-11: qty 1

## 2020-11-11 NOTE — ED Provider Notes (Signed)
[place] [facility] DEPT Provider Note   CSN: [id number] Arrival date & time: 11/11/20  0148     History Chief Complaint  Patient presents with  . Cough    Marie Wang is a 45 y.o. female.  The history is provided by the patient.  Cough Cough characteristics:  Productive Sputum characteristics:  Yellow Severity:  Moderate Onset quality:  Gradual Timing:  Intermittent Chronicity:  New Relieved by:  Nothing Worsened by:  Nothing Associated symptoms: no chest pain, no fever and no shortness of breath    Patient reports cough, congestion for the past 6 days.  She also reports feeling that her ears are "stuffed up " No ear pain.  No fevers or vomiting.  No chest pain or shortness of breath    Past Medical History:  Diagnosis Date  . Anxiety   . Cervical insufficiency during pregnancy in second trimester, antepartum 03/22/2015  . Chlamydia 2009  . Fibroid   . Hx gestational diabetes   . Leiomyoma 2013   small  . Miscarriage   . Polycystic ovaries   . Postpartum care following vaginal delivery (12/18) 06/19/2015  . Vaginal bleeding during pregnancy, antepartum 01/12/2015    Patient Active Problem List   Diagnosis Date Noted  . Normal labor 06/19/2015  . Postpartum care following vaginal delivery (12/18) 06/19/2015  . Cervical insufficiency during pregnancy in second trimester, antepartum 03/22/2015  . Cervical incompetence affecting management of pregnancy in second trimester, antepartum 03/22/2015  . Vaginal bleeding during pregnancy, antepartum 01/12/2015  . [redacted] weeks gestation of pregnancy   . Hx gestational diabetes     Past Surgical History:  Procedure Laterality Date  . MOUTH SURGERY  1996     OB History    Gravida  5   Para  2   Term  1   Preterm  1   AB  3   Living  2     SAB  2   IAB  1   Ectopic      Multiple  0   Live Births  2           Family History  Problem Relation Age of Onset  .  Hypertension Mother   . Breast cancer Paternal Aunt 48  . Ovarian cancer Maternal Grandmother   . Diabetes Paternal Grandmother   . Hypertension Father        borderline  . Diabetes Father     Social History   Tobacco Use  . Smoking status: Never Smoker  . Smokeless tobacco: Never Used  Vaping Use  . Vaping Use: Never used  Substance Use Topics  . Alcohol use: Yes    Comment: socially, not while pregnant  . Drug use: No    Home Medications Prior to Admission medications   Medication Sig Start Date End Date Taking? Authorizing Provider  benzonatate (TESSALON) 100 MG capsule Take 1 capsule (100 mg total) by mouth every 8 (eight) hours. 11/11/20  Yes Ripley Fraise, MD  albuterol (PROVENTIL HFA;VENTOLIN HFA) 108 (90 BASE) MCG/ACT inhaler Inhale 2 puffs into the lungs every 6 (six) hours as needed for wheezing or shortness of breath.    [provider]  escitalopram (LEXAPRO) 20 MG tablet Take 20 mg by mouth at bedtime.     [provider]  ESTARYLLA 0.25-35 MG-MCG tablet Take 1 tablet by mouth daily. 09/21/19   [provider]  FEROSUL 325 (65 Fe) MG tablet Take 325 mg by mouth in  the morning and at bedtime.  11/10/19   [provider]  metoprolol tartrate (LOPRESSOR) 25 MG tablet TAKE 1 TABLET(25 MG) BY MOUTH TWICE DAILY 10/13/20   Donato Heinz, MD  Multiple Vitamins-Minerals (MULTIVITAL PO) Take 1 tablet by mouth daily. Women's one A day     [provider]  omeprazole (PRILOSEC) 20 MG capsule Take 20 mg by mouth daily.    [provider]    Allergies    Codeine, Sulfa antibiotics, Ciprofloxacin, and Prednisone  Review of Systems   Review of Systems  Constitutional: Negative for fever.  Respiratory: Positive for cough. Negative for shortness of breath.   Cardiovascular: Negative for chest pain.    Physical Exam Updated Vital Signs BP 137/74 (BP Location: Right Arm)   Pulse 83   Temp 98.2 F (36.8 C) (Oral)    Resp 18   Ht 1.626 m (5\' 4" )   Wt 125 kg   LMP 10/15/2020 (Approximate)   SpO2 100%   BMI 47.30 kg/m   Physical Exam CONSTITUTIONAL: Well developed/well nourished HEAD: Normocephalic/atraumatic EYES: EOMI/PERRL ENMT: Mucous membranes moist, uvula midline, no erythema or exudate Bilateral TMs without erythema and intact NECK: supple no meningeal signs CV: S1/S2 noted, no murmurs/rubs/gallops noted LUNGS: Lungs are clear to auscultation bilaterally, no apparent distress ABDOMEN: soft, nontender, no rebound or guarding, bowel sounds noted throughout abdomen GU:no cva tenderness NEURO: Pt is awake/alert/appropriate, moves all extremitiesx4.  No facial droop.  Patient ambulates without difficulty EXTREMITIES:  full ROM SKIN: warm, color normal PSYCH: no abnormalities of mood noted, alert and oriented to situation  ED Results / Procedures / Treatments   Labs (all labs ordered are listed, but only abnormal results are displayed) Labs Reviewed  SARS CORONAVIRUS 2 (TAT 6-24 HRS)    EKG None  Radiology DG Chest 2 View  Result Date: 11/11/2020 CLINICAL DATA:  45 year old female with cough. EXAM: CHEST - 2 VIEW COMPARISON:  Chest radiograph dated 10/26/2019. FINDINGS: The heart size and mediastinal contours are within normal limits. Both lungs are clear. The visualized skeletal structures are unremarkable. IMPRESSION: No active cardiopulmonary disease. Electronically Signed   By: Anner Crete M.D.   On: 11/11/2020 03:48    Procedures Procedures   Medications Ordered in ED Medications  albuterol (VENTOLIN HFA) 108 (90 Base) MCG/ACT inhaler 2 puff (2 puffs Inhalation Given 11/11/20 0549)  aerochamber Z-Stat Plus/medium 1 each (1 each Other Given 11/11/20 0549)  dexamethasone (DECADRON) injection 10 mg (10 mg Intramuscular Given 11/11/20 0550)    ED Course  I have reviewed the triage vital signs and the nursing notes.  Pertinent imaging results that were available during my  care of the patient were reviewed by me and considered in my medical decision making (see chart for details).    MDM Rules/Calculators/A&P                          Chest x-ray is negative.  No hypoxia.  Patient is well-appearing.  Will discharge home No indication for  for antibiotics Final Clinical Impression(s) / ED Diagnoses Final diagnoses:  Cough  Viral URI with cough    Rx / DC Orders ED Discharge Orders         Ordered    benzonatate (TESSALON) 100 MG capsule  Every 8 hours        11/11/20 0606           Ripley Fraise, MD 11/11/20 [phone]

## 2020-11-11 NOTE — ED Provider Notes (Signed)
Emergency Medicine Provider Triage Evaluation Note  MOXIE KALIL , a 45 y.o. female  was evaluated in triage.  Pt complains of scratchy throat and allergies for almost 1 week.  Pt with sinus pain and nasal congestion.  Additionally with cough for 6 days.  Pt reports hx of PNA.  Hx of COVID April 2021.  Not vaccinated.  Taking Mucinex, zyrtec and flonase without relief.  Review of Systems  Positive: Nasal congestion, cough Negative: Fever, chills, N/V  Physical Exam  BP 137/74 (BP Location: Right Arm)   Pulse 83   Temp 98.2 F (36.8 C) (Oral)   Resp 18   Ht 5\' 4"  (1.626 m)   Wt 125 kg   LMP 10/15/2020 (Approximate)   SpO2 100%   BMI 47.30 kg/m   Gen:   Awake, no distress  Resp:  Normal effort, coarse breath sounds MSK:   Moves extremities without difficulty  Other:  Dry cough  Medical Decision Making  Medically screening exam initiated at 3:18 AM.  Appropriate orders placed.  GAIL VENDETTI was informed that the remainder of the evaluation will be completed by another provider, this initial triage assessment does not replace that evaluation, and the importance of remaining in the ED until their evaluation is complete.  Concern for CAP, COVID, asthma exacerbation, sinus infection.  W/U pending.  Cough    Mahlet Jergens, Gwenlyn Perking 11/11/20 0319    Ripley Fraise, MD 11/11/20 [phone]

## 2020-11-11 NOTE — ED Triage Notes (Signed)
Pt to ED with c/o productive cough which began a week ago and has been getting progressively worse. Pt states this started due to her allergies, but that it has gotten worse since then. Arrives A+O, VSS, NADN. Pt took home covid test and it was negative.

## 2021-03-21 IMAGING — CT CT ANGIO CHEST
2 of 6 series · 18 of 36 positions shown · IV contrast (omnipaque)
Comparison: None.

CLINICAL DATA: Shortness of breath

EXAM:
CT ANGIOGRAPHY CHEST WITH CONTRAST
TECHNIQUE: Multidetector CT imaging of the chest was performed using the
standard protocol during bolus administration of intravenous
contrast. Multiplanar CT image reconstructions and MIPs were
obtained to evaluate the vascular anatomy.
CONTRAST:  100mL OMNIPAQUE IOHEXOL 350 MG/ML SOLN

[Series 5: thins · axial · 0.71mm/px · z∈[-149,+91]mm · 17 of 270 slices shown]
[im 15/270  lung]
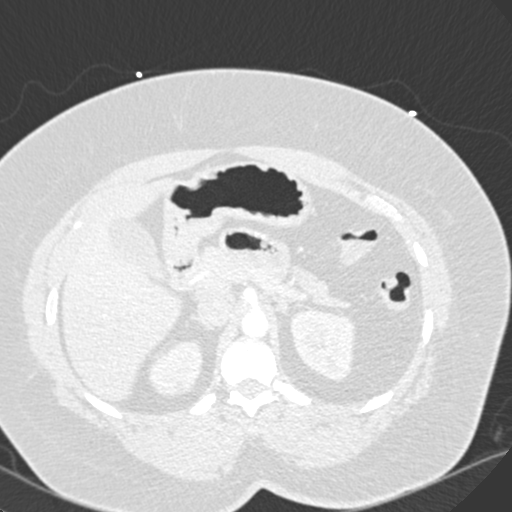
[im 30/270  mediastinal]
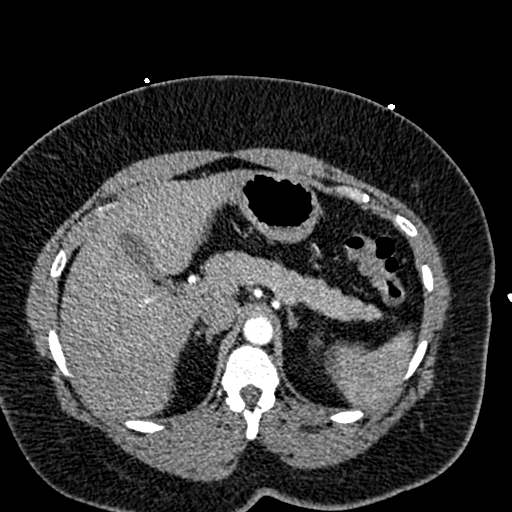
[im 45/270  lung]
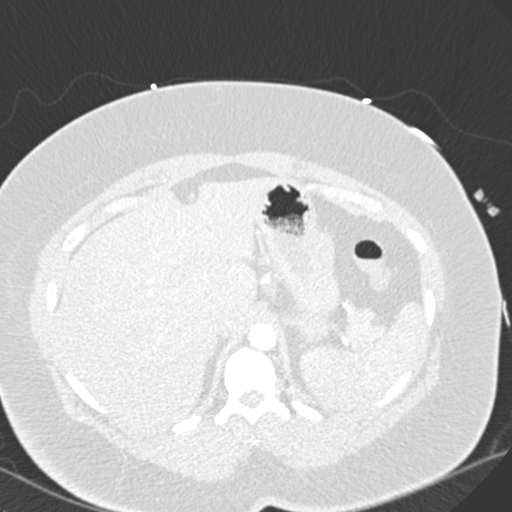
[im 60/270  mediastinal]
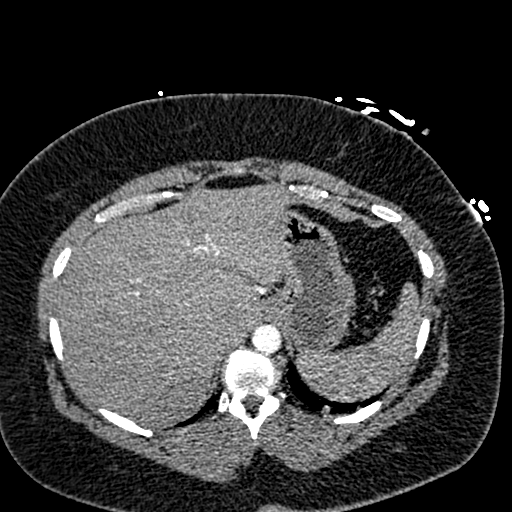
[im 75/270  lung]
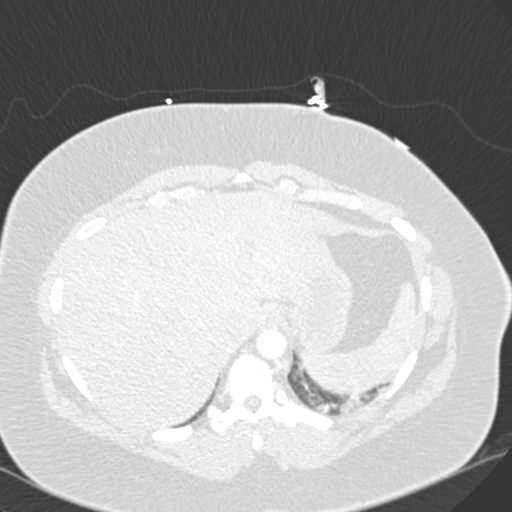
[im 90/270  mediastinal]
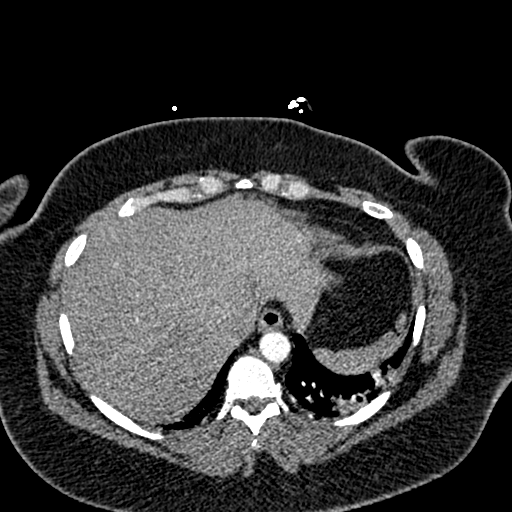
[im 105/270  lung]
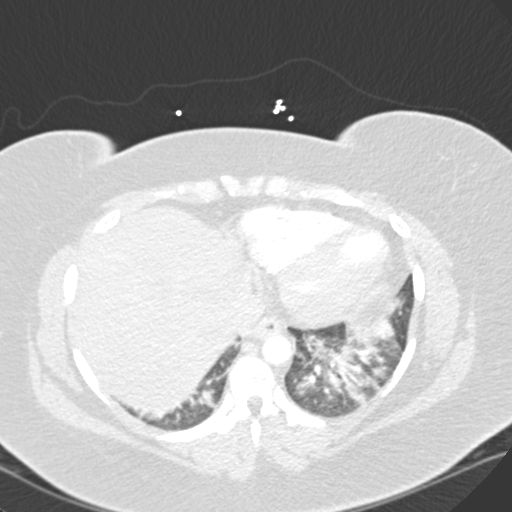
[im 120/270  mediastinal]
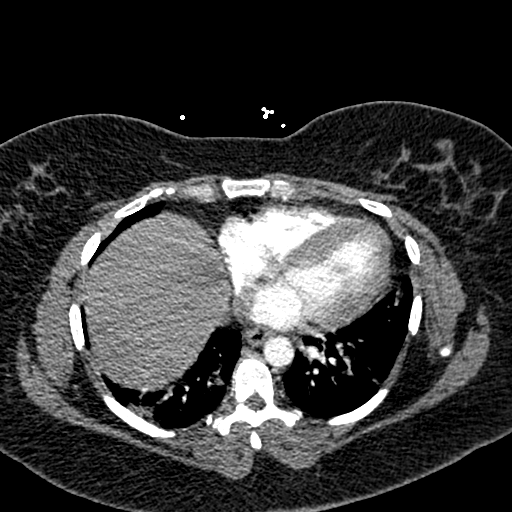
[im 135/270  lung]
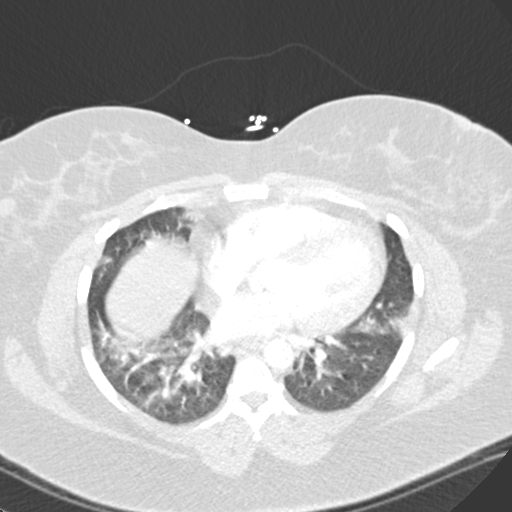
[im 150/270  mediastinal]
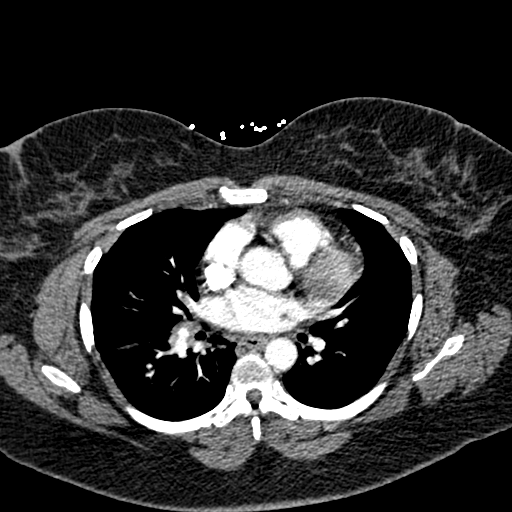
[im 165/270  lung]
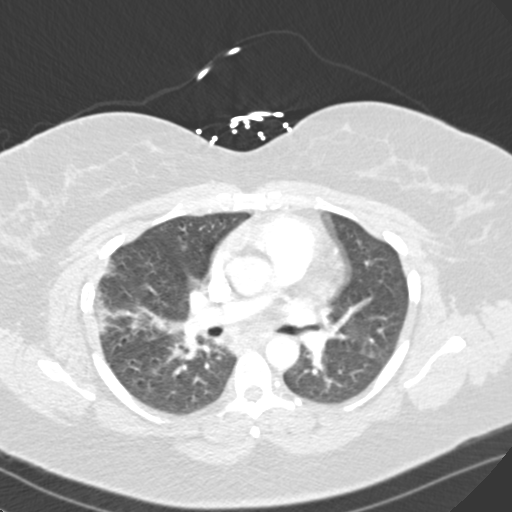
[im 180/270  mediastinal]
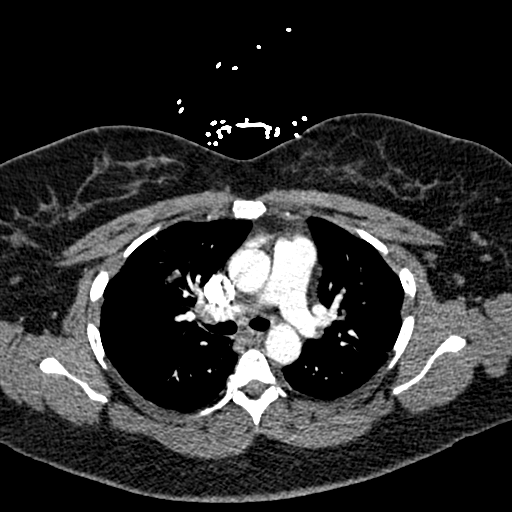
[im 195/270  lung]
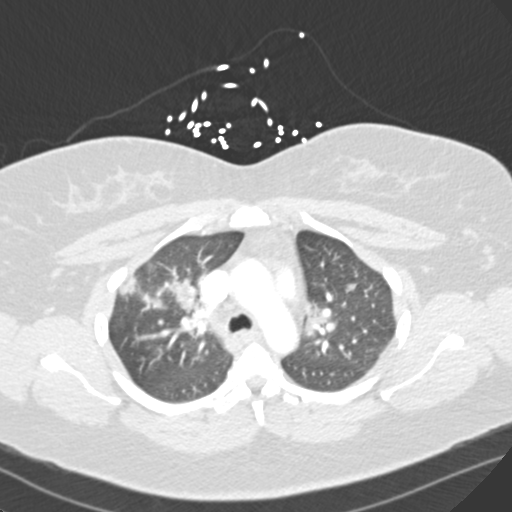
[im 210/270  mediastinal]
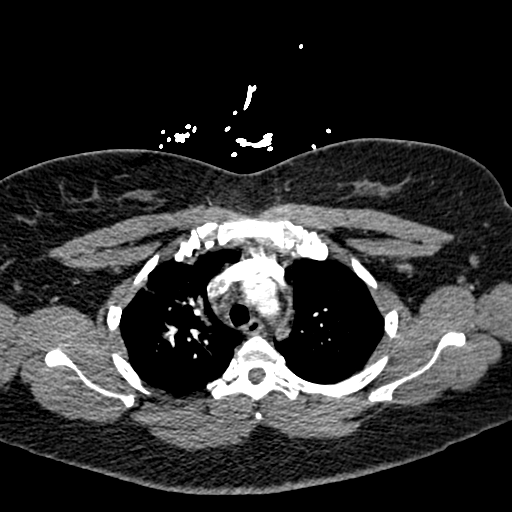
[im 225/270  lung]
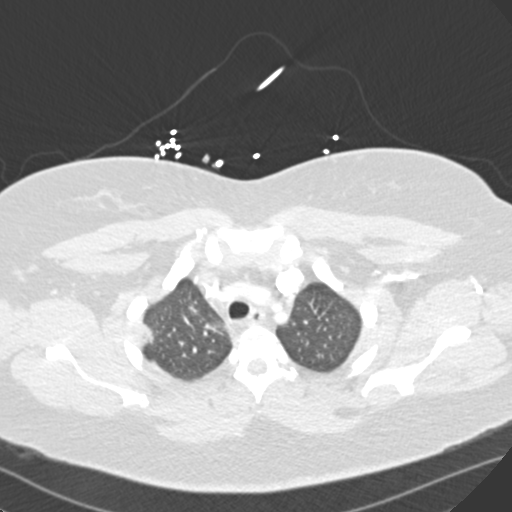
[im 240/270  mediastinal]
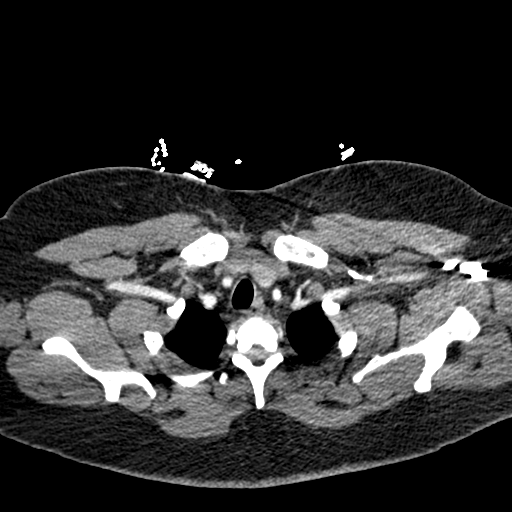
[im 255/270  lung]
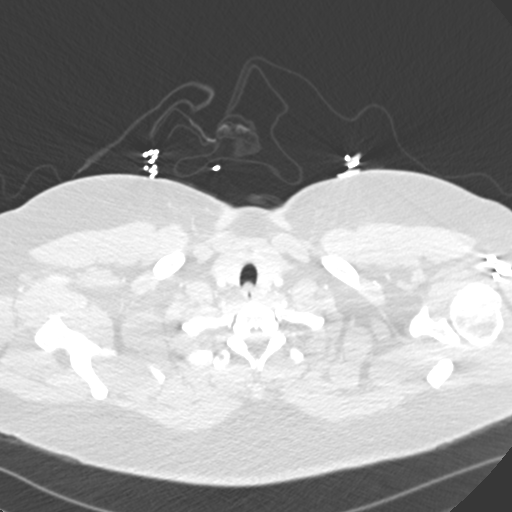

[Series 6: coronal mpr · coronal · 0.55mm/px · 1 of 172 slices shown]
[im 86/172  mediastinal]
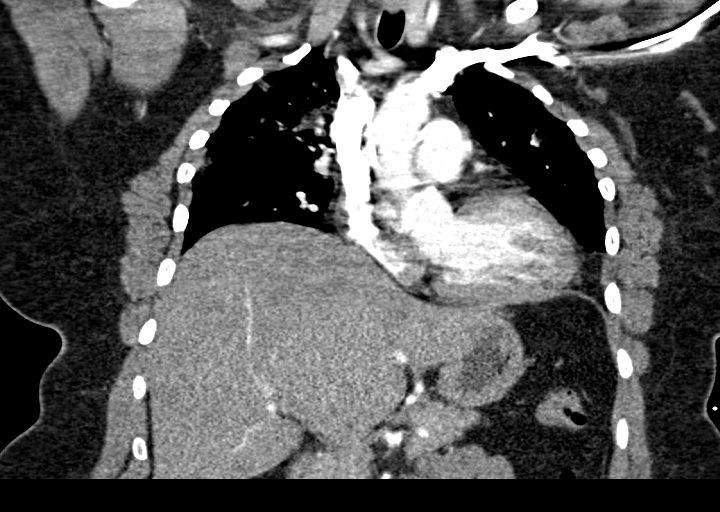

[18 of 36 positions shown; findings below may reference images not displayed]

FINDINGS: Cardiovascular: Heart is normal size. Aorta is normal caliber. No
filling defects in the pulmonary arteries to suggest pulmonary
emboli.

Mediastinum/Nodes: Small mediastinal and right hilar lymph nodes,
none pathologically enlarged. No axillary adenopathy.

Lungs/Pleura: Patchy bilateral ground-glass airspace disease in both
lungs most compatible with multifocal pneumonia, likely COVID
pneumonia. No effusions.

Upper Abdomen: Imaging into the upper abdomen shows no acute
findings.

Musculoskeletal: Chest wall soft tissues are unremarkable. No acute
bony abnormality.

Review of the MIP images confirms the above findings.
IMPRESSION: Patchy bilateral ground-glass airspace disease compatible with
multifocal pneumonia, likely COVID pneumonia.

No evidence of pulmonary embolus.

## 2021-03-22 ENCOUNTER — Encounter (HOSPITAL_COMMUNITY): Payer: Self-pay

## 2021-03-22 ENCOUNTER — Other Ambulatory Visit: Payer: Self-pay

## 2021-03-22 ENCOUNTER — Emergency Department (HOSPITAL_COMMUNITY)
Admission: EM | Admit: 2021-03-22 | Discharge: 2021-03-22 | Disposition: A | Discharge status: Home / Self Care | Payer: Medicaid Other | Source: Home / Self Care | Attending: Emergency Medicine | Admitting: Emergency Medicine

## 2021-03-22 ENCOUNTER — Emergency Department (HOSPITAL_COMMUNITY)
Admission: EM | Admit: 2021-03-22 | Discharge: 2021-03-22 | Disposition: A | Discharge status: Home / Self Care | Payer: Medicaid Other | Attending: Emergency Medicine | Admitting: Emergency Medicine

## 2021-03-22 DIAGNOSIS — R0981 Nasal congestion: Secondary | ICD-10-CM | POA: Diagnosis present

## 2021-03-22 DIAGNOSIS — Z5321 Procedure and treatment not carried out due to patient leaving prior to being seen by health care provider: Secondary | ICD-10-CM | POA: Insufficient documentation

## 2021-03-22 DIAGNOSIS — U071 COVID-19: Secondary | ICD-10-CM

## 2021-03-22 LAB — CBC WITH DIFFERENTIAL/PLATELET
Abs Immature Granulocytes: 0.02 10*3/uL (ref 0.00–0.07)
Basophils Absolute: 0 10*3/uL (ref 0.0–0.1)
Basophils Relative: 0 %
Eosinophils Absolute: 0.2 10*3/uL (ref 0.0–0.5)
Eosinophils Relative: 2 %
HCT: 40.5 % (ref 36.0–46.0)
Hemoglobin: 13 g/dL (ref 12.0–15.0)
Immature Granulocytes: 0 %
Lymphocytes Relative: 38 %
Lymphs Abs: 2.6 10*3/uL (ref 0.7–4.0)
MCH: 27.9 pg (ref 26.0–34.0)
MCHC: 32.1 g/dL (ref 30.0–36.0)
MCV: 86.9 fL (ref 80.0–100.0)
Monocytes Absolute: 0.7 10*3/uL (ref 0.1–1.0)
Monocytes Relative: 9 %
Neutro Abs: 3.5 10*3/uL (ref 1.7–7.7)
Neutrophils Relative %: 51 %
Platelets: 278 10*3/uL (ref 150–400)
RBC: 4.66 MIL/uL (ref 3.87–5.11)
RDW: 12.8 % (ref 11.5–15.5)
WBC: 6.9 10*3/uL (ref 4.0–10.5)
nRBC: 0 % (ref 0.0–0.2)

## 2021-03-22 LAB — COMPREHENSIVE METABOLIC PANEL
ALT: 21 U/L (ref 0–44)
AST: 17 U/L (ref 15–41)
Albumin: 3.6 g/dL (ref 3.5–5.0)
Alkaline Phosphatase: 64 U/L (ref 38–126)
Anion gap: 11 (ref 5–15)
BUN: 6 mg/dL (ref 6–20)
CO2: 21 mmol/L — ABNORMAL LOW (ref 22–32)
Calcium: 8.6 mg/dL — ABNORMAL LOW (ref 8.9–10.3)
Chloride: 103 mmol/L (ref 98–111)
Creatinine, Ser: 0.69 mg/dL (ref 0.44–1.00)
GFR, Estimated: >60 mL/min (ref >60)
Glucose, Bld: 170 mg/dL — ABNORMAL HIGH (ref 70–99)
Potassium: 3.3 mmol/L — ABNORMAL LOW (ref 3.5–5.1)
Sodium: 135 mmol/L (ref 135–145)
Total Bilirubin: 0.5 mg/dL (ref 0.3–1.2)
Total Protein: 7.9 g/dL (ref 6.5–8.1)

## 2021-03-22 LAB — URINALYSIS, ROUTINE W REFLEX MICROSCOPIC
Bilirubin Urine: NEGATIVE
Glucose, UA: NEGATIVE mg/dL
Ketones, ur: 5 mg/dL — AB
Leukocytes,Ua: NEGATIVE
Nitrite: NEGATIVE
Protein, ur: NEGATIVE mg/dL
Specific Gravity, Urine: 1.005 (ref 1.005–1.030)
pH: 6 (ref 5.0–8.0)

## 2021-03-22 LAB — RESP PANEL BY RT-PCR (FLU A&B, COVID) ARPGX2
Influenza A by PCR: NEGATIVE
Influenza B by PCR: NEGATIVE
SARS Coronavirus 2 by RT PCR: POSITIVE — AB

## 2021-03-22 NOTE — ED Triage Notes (Signed)
Pt complains of congestion x 2 days. Pt states that her ears and head feel "full".

## 2021-03-22 NOTE — ED Triage Notes (Signed)
Pt complains of congestion x 2 days. Pt states that her ears and head feel "full". Pt is Covid +. Pt states that she talked to her provider on the phone and they feel that she may be dehydrated from OTC medications.

## 2021-03-22 NOTE — Discharge Instructions (Signed)
The reason for your congestion is her COVID infection.  This is a viral illness that will take time to get better.  You may use symptomatic and supportive medications in the meantime. Use Flonase to help with your nasal congestion. Make sure you are using humidified air to help break up the congestion. Make sure you stay well-hydrated with water. Follow-up with your primary care doctor as needed for recheck of your symptoms. Return to the emergency room if you develop chest pain, shortness of breath, or any new commotion, concerning symptoms

## 2021-03-22 NOTE — ED Provider Notes (Signed)
[facility] DEPT Provider Note   CSN: [id number] Arrival date & time: 03/22/21  0631     History Chief Complaint  Patient presents with   Nasal Congestion    Marie Wang is a 45 y.o. female presenting for evaluation of nasal congestion and cough.  Patient states she developed symptoms 6 days ago.  She tested positive for COVID the day after her symptoms presented.  She has been treating symptomatically with Flonase and Mucinex.  She called her PCP yesterday because she continues to have congestion.  Her PCP was concerned about possible dehydration because patient mentioned that she is feeling thirsty.  She denies fevers, chest pain, shortness of breath, nausea, vomiting, abd pain, urinary symptoms, abnormal bowel movements.  She is eating and drinking well.  She has a history of diabetes, states her blood sugars have been well controlled.  Additional history of exercise-induced asthma, has not needed her inhaler.  She does report a frequent history of sinusitis, often needs a steroid shot for this.  However states that she is not having significant sinus pain, which is classic for her when she has sinusitis.   HPI     Past Medical History:  Diagnosis Date   Anxiety    Cervical insufficiency during pregnancy in second trimester, antepartum 03/22/2015   Chlamydia 2009   Fibroid    Hx gestational diabetes    Leiomyoma 2013   small   Miscarriage    Polycystic ovaries    Postpartum care following vaginal delivery (12/18) 06/19/2015   Vaginal bleeding during pregnancy, antepartum 01/12/2015    Patient Active Problem List   Diagnosis Date Noted   Normal labor 06/19/2015   Postpartum care following vaginal delivery (12/18) 06/19/2015   Cervical insufficiency during pregnancy in second trimester, antepartum 03/22/2015   Cervical incompetence affecting management of pregnancy in second trimester, antepartum 03/22/2015   Vaginal bleeding during  pregnancy, antepartum 01/12/2015   [redacted] weeks gestation of pregnancy    Hx gestational diabetes     Past Surgical History:  Procedure Laterality Date   MOUTH SURGERY  1996     OB History     Gravida  5   Para  2   Term  1   Preterm  1   AB  3   Living  2      SAB  2   IAB  1   Ectopic      Multiple  0   Live Births  2           Family History  Problem Relation Age of Onset   Hypertension Mother    Breast cancer Paternal Aunt 104   Ovarian cancer Maternal Grandmother    Diabetes Paternal Grandmother    Hypertension Father        borderline   Diabetes Father     Social History   Tobacco Use   Smoking status: Never   Smokeless tobacco: Never  Vaping Use   Vaping Use: Never used  Substance Use Topics   Alcohol use: Yes    Comment: socially, not while pregnant   Drug use: No    Home Medications Prior to Admission medications   Medication Sig Start Date End Date Taking? Authorizing Provider  albuterol (PROVENTIL HFA;VENTOLIN HFA) 108 (90 BASE) MCG/ACT inhaler Inhale 2 puffs into the lungs every 6 (six) hours as needed for wheezing or shortness of breath.    [provider]  benzonatate (TESSALON) 100 MG capsule  Take 1 capsule (100 mg total) by mouth every 8 (eight) hours. 11/11/20   Ripley Fraise, MD  escitalopram (LEXAPRO) 20 MG tablet Take 20 mg by mouth at bedtime.     [provider]  ESTARYLLA 0.25-35 MG-MCG tablet Take 1 tablet by mouth daily. 09/21/19   [provider]  FEROSUL 325 (65 Fe) MG tablet Take 325 mg by mouth in the morning and at bedtime.  11/10/19   [provider]  metoprolol tartrate (LOPRESSOR) 25 MG tablet TAKE 1 TABLET(25 MG) BY MOUTH TWICE DAILY 10/13/20   Donato Heinz, MD  Multiple Vitamins-Minerals (MULTIVITAL PO) Take 1 tablet by mouth daily. Women's one A day     [provider]  omeprazole (PRILOSEC) 20 MG capsule Take 20 mg by mouth daily.    [provider]    Allergies    Codeine, Sulfa antibiotics, Ciprofloxacin, and Prednisone  Review of Systems   Review of Systems  HENT:  Positive for congestion.   Respiratory:  Positive for cough.   All other systems reviewed and are negative.  Physical Exam Updated Vital Signs BP (!) 157/91   Pulse 86   Temp 98.1 F (36.7 C) (Oral)   Resp 18   SpO2 97%   Physical Exam Vitals and nursing note reviewed.  Constitutional:      General: She is not in acute distress.    Appearance: Normal appearance.     Comments: Resting comfortably in the bed in no acute distress  HENT:     Head: Normocephalic and atraumatic.     Comments: Mucous membranes moist.  OP clear without tonsillar swelling or exudate.  Uvula midline.  Nasal mucosal edema and congestion noted.  No tenderness palpation over the sinuses.  Clear fluid noted behind bilateral TMs without bulging or erythema.    Mouth/Throat:     Mouth: Mucous membranes are moist.  Eyes:     Conjunctiva/sclera: Conjunctivae normal.     Pupils: Pupils are equal, round, and reactive to light.  Cardiovascular:     Rate and Rhythm: Normal rate and regular rhythm.     Pulses: Normal pulses.  Pulmonary:     Effort: Pulmonary effort is normal. No respiratory distress.     Breath sounds: Normal breath sounds. No wheezing.     Comments: Speaking in full sentences.  Clear lung sounds in all fields. Abdominal:     General: There is no distension.     Palpations: Abdomen is soft. There is no mass.     Tenderness: There is no abdominal tenderness. There is no guarding or rebound.  Musculoskeletal:        General: Normal range of motion.     Cervical back: Normal range of motion and neck supple.  Lymphadenopathy:     Cervical: No cervical adenopathy.  Skin:    General: Skin is warm and dry.     Capillary Refill: Capillary refill takes less than 2 seconds.  Neurological:     Mental Status: She is alert and oriented to person, place, and time.   Psychiatric:        Mood and Affect: Mood and affect normal.        Speech: Speech normal.        Behavior: Behavior normal.    ED Results / Procedures / Treatments   Labs (all labs ordered are listed, but only abnormal results are displayed) Labs Reviewed  COMPREHENSIVE METABOLIC PANEL - Abnormal; Notable for the following components:  Result Value   Potassium 3.3 (*)    CO2 21 (*)    Glucose, Bld 170 (*)    Calcium 8.6 (*)    All other components within normal limits  URINALYSIS, ROUTINE W REFLEX MICROSCOPIC - Abnormal; Notable for the following components:   Hgb urine dipstick SMALL (*)    Ketones, ur 5 (*)    Bacteria, UA RARE (*)    All other components within normal limits  CBC WITH DIFFERENTIAL/PLATELET    EKG None  Radiology No results found.  Procedures Procedures   Medications Ordered in ED Medications - No data to display  ED Course  I have reviewed the triage vital signs and the nursing notes.  Pertinent labs & imaging results that were available during my care of the patient were reviewed by me and considered in my medical decision making (see chart for details).    MDM Rules/Calculators/A&P                           Patient presenting for evaluation of nasal congestion and cough in the setting of known COVID infection.  On exam, patient appears nontoxic.  She has some signs of congestion, but has no sinus tenderness.  Doubt acute sinusitis that would need steroids.  Pulmonary exam is overall reassuring, no wheezing and she has not needed her inhaler.  Sats are 98% on room air.  Clinically, patient does not appear dehydrated.  She is not tachycardic, hypotensive, and her blood work is overall reassuring.  Discussed these findings with the patient.  Discussed continued symptomatic management with humidified air and over-the-counter medications.  Encourage follow-up with PCP as needed.  As patient is past the 5-day timeframe from onset of symptoms, she  is not eligible for antivirals.  At this time, patient appears safe for discharge.  Return precautions given.  Patient states she understands and agrees to plan.  Final Clinical Impression(s) / ED Diagnoses Final diagnoses:  COVID-19  Nasal congestion    Rx / DC Orders ED Discharge Orders     None        Franchot Heidelberg, PA-C 03/22/21 5400    Wyvonnia Dusky, MD 03/22/21 [phone]

## 2021-04-22 ENCOUNTER — Other Ambulatory Visit: Payer: Self-pay | Admitting: Cardiology

## 2021-10-22 ENCOUNTER — Other Ambulatory Visit: Payer: Self-pay | Admitting: Cardiology

## 2022-05-29 DIAGNOSIS — J029 Acute pharyngitis, unspecified: Secondary | ICD-10-CM | POA: Diagnosis not present

## 2022-05-29 DIAGNOSIS — R5383 Other fatigue: Secondary | ICD-10-CM | POA: Diagnosis not present

## 2022-08-09 ENCOUNTER — Other Ambulatory Visit: Payer: Self-pay | Admitting: Family Medicine

## 2022-08-09 ENCOUNTER — Ambulatory Visit
Admission: RE | Admit: 2022-08-09 | Discharge: 2022-08-09 | Disposition: A | Discharge status: Home / Self Care | Payer: Worker's Compensation | Source: Ambulatory Visit | Attending: Family Medicine | Admitting: Family Medicine

## 2022-08-09 DIAGNOSIS — S99911A Unspecified injury of right ankle, initial encounter: Secondary | ICD-10-CM

## 2022-08-09 DIAGNOSIS — J019 Acute sinusitis, unspecified: Secondary | ICD-10-CM | POA: Diagnosis not present

## 2022-08-09 DIAGNOSIS — F419 Anxiety disorder, unspecified: Secondary | ICD-10-CM | POA: Diagnosis not present

## 2022-10-09 ENCOUNTER — Other Ambulatory Visit (HOSPITAL_COMMUNITY)
Admission: RE | Admit: 2022-10-09 | Discharge: 2022-10-09 | Disposition: A | Discharge status: Home / Self Care | Payer: BC Managed Care – PPO | Source: Ambulatory Visit | Attending: Family Medicine | Admitting: Family Medicine

## 2022-10-09 DIAGNOSIS — Z01411 Encounter for gynecological examination (general) (routine) with abnormal findings: Secondary | ICD-10-CM | POA: Diagnosis not present

## 2022-10-09 DIAGNOSIS — E559 Vitamin D deficiency, unspecified: Secondary | ICD-10-CM | POA: Diagnosis not present

## 2022-10-09 DIAGNOSIS — Z0000 Encounter for general adult medical examination without abnormal findings: Secondary | ICD-10-CM | POA: Diagnosis not present

## 2022-10-09 DIAGNOSIS — E1165 Type 2 diabetes mellitus with hyperglycemia: Secondary | ICD-10-CM | POA: Diagnosis not present

## 2022-10-09 DIAGNOSIS — Z79899 Other long term (current) drug therapy: Secondary | ICD-10-CM | POA: Diagnosis not present

## 2022-10-15 LAB — CYTOLOGY - PAP
Comment: NEGATIVE
Diagnosis: NEGATIVE
High risk HPV: NEGATIVE

## 2022-11-30 DIAGNOSIS — J0100 Acute maxillary sinusitis, unspecified: Secondary | ICD-10-CM | POA: Diagnosis not present

## 2022-12-12 DIAGNOSIS — J0100 Acute maxillary sinusitis, unspecified: Secondary | ICD-10-CM | POA: Diagnosis not present

## 2022-12-12 DIAGNOSIS — H6993 Unspecified Eustachian tube disorder, bilateral: Secondary | ICD-10-CM | POA: Diagnosis not present

## 2022-12-31 ENCOUNTER — Ambulatory Visit: Payer: BC Managed Care – PPO | Attending: Cardiology | Admitting: Cardiology

## 2022-12-31 NOTE — Progress Notes (Deleted)
Cardiology Office Note:    Date:  12/31/2022   ID:  Marie Wang, DOB [birth year], MRN [id number]  PCP:  Johnn Hai, MD (Inactive)  Cardiologist:  Little Ishikawa, MD  Electrophysiologist:  None   Referring MD: No ref. provider found   No chief complaint on file.   History of Present Illness:    Marie Wang is a 47 y.o. female with a hx of asthma, anxiety, prediabetes who presents for follow-up.  She was referred by Dr. Docia Chuck for evaluation of palpitations, initiaqlly seen 10/02/19.  Presented with palpitations to [facility] ED on 09/29/2019.  EKG showed SVT with HR 165, she was given adenosine 6 mg and converted to normal sinus rhythm.  Echocardiogram 11/18/19 showed EF 60-65%, normal RV function, no significant valvular disease, moderate LAE.  Since last clinic visit,   Past Medical History:  Diagnosis Date   Anxiety    Cervical insufficiency during pregnancy in second trimester, antepartum 03/22/2015   Chlamydia 2009   Fibroid    Hx gestational diabetes    Leiomyoma 2013   small   Miscarriage    Polycystic ovaries    Postpartum care following vaginal delivery (12/18) 06/19/2015   Vaginal bleeding during pregnancy, antepartum 01/12/2015    Past Surgical History:  Procedure Laterality Date   MOUTH SURGERY  1996    Current Medications: No outpatient medications have been marked as taking for the 12/31/22 encounter (Appointment) with Little Ishikawa, MD.     Allergies:   Codeine, Sulfa antibiotics, Ciprofloxacin, and Prednisone   Social History   Socioeconomic History   Marital status: Single    Spouse name: Not on file   Number of children: Not on file   Years of education: Not on file   Highest education level: Not on file  Occupational History   Not on file  Tobacco Use   Smoking status: Never   Smokeless tobacco: Never  Vaping Use   Vaping Use: Never used  Substance and Sexual Activity   Alcohol use: Yes    Comment: socially,  not while pregnant   Drug use: No   Sexual activity: Yes    Partners: Male    Birth control/protection: None    Comment: one week ago last sex  Other Topics Concern   Not on file  Social History Narrative   Not on file   Social Determinants of Health   Financial Resource Strain: Not on file  Food Insecurity: Not on file  Transportation Needs: Not on file  Physical Activity: Not on file  Stress: Not on file  Social Connections: Not on file     Family History: The patient's family history includes Breast cancer (age of onset: 58) in her paternal aunt; Diabetes in her father and paternal grandmother; Hypertension in her father and mother; Ovarian cancer in her maternal grandmother.  ROS:   Please see the history of present illness.     All other systems reviewed and are negative.  EKGs/Labs/Other Studies Reviewed:    The following studies were reviewed today:   EKG:  EKG is  ordered today.  The ekg ordered today demonstrates normal sinus rhythm, rate 81, no ST/T abnormality  EKG from ED 09/29/19: SVT, rate 165  Recent Labs: No results found for requested labs within last 365 days.  Recent Lipid Panel    Component Value Date/Time   CHOL 181 10/13/2012 1557   TRIG 149 10/13/2012 1557   HDL 62 10/13/2012 1557  CHOLHDL 2.9 10/13/2012 1557   VLDL 30 10/13/2012 1557   LDLCALC 89 10/13/2012 1557    Physical Exam:    VS:  There were no vitals taken for this visit.    Wt Readings from Last 3 Encounters:  11/11/20 275 lb 9.2 oz (125 kg)  03/01/20 274 lb (124.3 kg)  01/03/20 260 lb (117.9 kg)     GEN: Well nourished, well developed in no acute distress HEENT: Normal NECK: No JVD; No carotid bruits LYMPHATICS: No lymphadenopathy CARDIAC: RRR, no murmurs, rubs, gallops RESPIRATORY:  Clear to auscultation without rales, wheezing or rhonchi  ABDOMEN: Soft, non-tender, non-distended MUSCULOSKELETAL:  No edema; No deformity  SKIN: Warm and dry NEUROLOGIC:  Alert and  oriented x 3 PSYCHIATRIC:  Normal affect   ASSESSMENT:    No diagnosis found.  PLAN:    SVT: required ED visit and adenosine to break episode in 2021.  Echocardiogram 11/18/19 showed EF 60-65%, normal RV function, no significant valvular disease, moderate LAE. -Continue metoprolol 25 mg twice daily -TSH normal on 06/21/19.  Recommended avoiding triggers, specifically caffeine and alcohol  Prediabetes: A1c 6.1 on 07/01/2019  RTC in 3 months  Medication Adjustments/Labs and Tests Ordered: Current medicines are reviewed at length with the patient today.  Concerns regarding medicines are outlined above.  No orders of the defined types were placed in this encounter.  No orders of the defined types were placed in this encounter.   There are no Patient Instructions on file for this visit.   Signed, Little Ishikawa, MD  12/31/2022 10:12 AM    [facility]

## 2023-02-11 ENCOUNTER — Other Ambulatory Visit: Payer: Self-pay | Admitting: Family Medicine

## 2023-02-11 DIAGNOSIS — N63 Unspecified lump in unspecified breast: Secondary | ICD-10-CM

## 2023-02-13 DIAGNOSIS — E1165 Type 2 diabetes mellitus with hyperglycemia: Secondary | ICD-10-CM | POA: Diagnosis not present

## 2023-02-13 DIAGNOSIS — L732 Hidradenitis suppurativa: Secondary | ICD-10-CM | POA: Diagnosis not present

## 2023-02-20 ENCOUNTER — Ambulatory Visit: Admission: RE | Admit: 2023-02-20 | Payer: BC Managed Care – PPO | Source: Ambulatory Visit

## 2023-02-20 ENCOUNTER — Ambulatory Visit
Admission: RE | Admit: 2023-02-20 | Discharge: 2023-02-20 | Disposition: A | Discharge status: Home / Self Care | Payer: BC Managed Care – PPO | Source: Ambulatory Visit | Attending: Family Medicine | Admitting: Family Medicine

## 2023-02-20 DIAGNOSIS — N6459 Other signs and symptoms in breast: Secondary | ICD-10-CM | POA: Diagnosis not present

## 2023-02-20 DIAGNOSIS — N63 Unspecified lump in unspecified breast: Secondary | ICD-10-CM

## 2023-04-02 DIAGNOSIS — L299 Pruritus, unspecified: Secondary | ICD-10-CM | POA: Diagnosis not present

## 2023-04-12 DIAGNOSIS — J209 Acute bronchitis, unspecified: Secondary | ICD-10-CM | POA: Diagnosis not present

## 2023-04-20 DIAGNOSIS — J208 Acute bronchitis due to other specified organisms: Secondary | ICD-10-CM | POA: Diagnosis not present

## 2023-05-07 DIAGNOSIS — R052 Subacute cough: Secondary | ICD-10-CM | POA: Diagnosis not present

## 2023-05-07 DIAGNOSIS — L299 Pruritus, unspecified: Secondary | ICD-10-CM | POA: Diagnosis not present

## 2023-05-07 DIAGNOSIS — J3089 Other allergic rhinitis: Secondary | ICD-10-CM | POA: Diagnosis not present

## 2023-06-03 DIAGNOSIS — E1165 Type 2 diabetes mellitus with hyperglycemia: Secondary | ICD-10-CM | POA: Diagnosis not present

## 2023-06-03 DIAGNOSIS — F419 Anxiety disorder, unspecified: Secondary | ICD-10-CM | POA: Diagnosis not present

## 2023-06-25 DIAGNOSIS — E1165 Type 2 diabetes mellitus with hyperglycemia: Secondary | ICD-10-CM | POA: Diagnosis not present

## 2023-07-03 DIAGNOSIS — E119 Type 2 diabetes mellitus without complications: Secondary | ICD-10-CM | POA: Diagnosis not present

## 2023-09-24 DIAGNOSIS — E1165 Type 2 diabetes mellitus with hyperglycemia: Secondary | ICD-10-CM | POA: Diagnosis not present

## 2023-09-27 DIAGNOSIS — E1165 Type 2 diabetes mellitus with hyperglycemia: Secondary | ICD-10-CM | POA: Diagnosis not present

## 2023-09-27 DIAGNOSIS — J0100 Acute maxillary sinusitis, unspecified: Secondary | ICD-10-CM | POA: Diagnosis not present

## 2023-10-03 DIAGNOSIS — R0981 Nasal congestion: Secondary | ICD-10-CM | POA: Diagnosis not present

## 2023-10-03 DIAGNOSIS — J302 Other seasonal allergic rhinitis: Secondary | ICD-10-CM | POA: Diagnosis not present

## 2023-10-03 DIAGNOSIS — Z03818 Encounter for observation for suspected exposure to other biological agents ruled out: Secondary | ICD-10-CM | POA: Diagnosis not present

## 2023-10-11 DIAGNOSIS — E1165 Type 2 diabetes mellitus with hyperglycemia: Secondary | ICD-10-CM | POA: Diagnosis not present

## 2023-10-11 DIAGNOSIS — E559 Vitamin D deficiency, unspecified: Secondary | ICD-10-CM | POA: Diagnosis not present

## 2023-10-11 DIAGNOSIS — Z79899 Other long term (current) drug therapy: Secondary | ICD-10-CM | POA: Diagnosis not present

## 2023-10-11 DIAGNOSIS — Z0000 Encounter for general adult medical examination without abnormal findings: Secondary | ICD-10-CM | POA: Diagnosis not present

## 2023-10-22 ENCOUNTER — Encounter: Payer: Self-pay | Admitting: Internal Medicine

## 2023-11-22 ENCOUNTER — Ambulatory Visit: Admitting: *Deleted

## 2023-11-22 VITALS — Ht 64.0 in | Wt 252.0 lb

## 2023-11-22 DIAGNOSIS — Z1211 Encounter for screening for malignant neoplasm of colon: Secondary | ICD-10-CM

## 2023-11-22 MED ORDER — NA SULFATE-K SULFATE-MG SULF 17.5-3.13-1.6 GM/177ML PO SOLN: 1.0000 | Freq: Once | ORAL | 0 refills | Status: AC

## 2023-11-22 NOTE — Progress Notes (Signed)
 Pt's name and DOB verified at the beginning of the pre-visit wit 2 identifiers  Pt denies any difficulty with ambulating,sitting, laying down or rolling side to side  Pt has no issues moving head neck or swallowing  No egg or soy allergy known to patient   No issues known to pt with past sedation with any surgeries or procedures  Patient denies ever being intubated  No FH of Malignant Hyperthermia  Pt is not on home 02   Pt is not on blood thinners   Pt denies issues with constipation   Pt is not on dialysis  Pt dhx of SVT  Pt denies any upcoming cardiac testing  Patient's chart reviewed by Rogena Class CNRA prior to pre-visit and patient appropriate for the [facility].  Pre-visit completed and red dot placed by patient's name on their procedure day (on provider's schedule).     Visit by phone  Pt states weight is 252 lb  IInstructions reviewed. Pt given  both [facility] main # and MD on call # prior to instructions.  Pt states understanding of instructions. Instructed pt to review instructions again prior to procedure and call main # given if has questions.. Pt states they will.   Instructed pt on where to find instructions on My Chart.

## 2023-11-26 ENCOUNTER — Encounter: Payer: Self-pay | Admitting: Internal Medicine

## 2023-12-06 ENCOUNTER — Encounter: Payer: Self-pay | Admitting: Internal Medicine

## 2023-12-06 ENCOUNTER — Ambulatory Visit: Admitting: Internal Medicine

## 2023-12-06 VITALS — BP 122/81 | HR 61 | Temp 97.5°F | Resp 23 | Ht 64.0 in | Wt 252.0 lb

## 2023-12-06 DIAGNOSIS — D122 Benign neoplasm of ascending colon: Secondary | ICD-10-CM | POA: Diagnosis not present

## 2023-12-06 DIAGNOSIS — Z1211 Encounter for screening for malignant neoplasm of colon: Secondary | ICD-10-CM

## 2023-12-06 DIAGNOSIS — K648 Other hemorrhoids: Secondary | ICD-10-CM

## 2023-12-06 MED ORDER — SODIUM CHLORIDE 0.9 % IV SOLN: 500.0000 mL | Freq: Once | INTRAVENOUS | Status: DC

## 2023-12-06 NOTE — Progress Notes (Signed)
 Pt's states no medical or surgical changes since previsit or office visit.

## 2023-12-06 NOTE — Progress Notes (Signed)
 Called to room to assist during endoscopic procedure.  Patient ID and intended procedure confirmed with present staff. Received instructions for my participation in the procedure from the performing physician.

## 2023-12-06 NOTE — Progress Notes (Signed)
 GASTROENTEROLOGY PROCEDURE H&P NOTE   Primary Care Physician: Sophia Dustman, MD (Inactive)    Reason for Procedure:   Colon cancer screening  Plan:    Colonoscopy  Patient is appropriate for endoscopic procedure(s) in the ambulatory ([facility]) setting.  The nature of the procedure, as well as the risks, benefits, and alternatives were carefully and thoroughly reviewed with the patient. Ample time for discussion and questions allowed. The patient understood, was satisfied, and agreed to proceed.     HPI: Marie Wang is a 48 y.o. female who presents for colonoscopy for colon cancer screening. Denies blood in stools, changes in bowel habits, or unintentional weight loss. Denies family history of colon cancer.  Past Medical History:  Diagnosis Date   Anal fissure    Anxiety    Asthma    Cervical insufficiency during pregnancy in second trimester, antepartum 03/22/2015   Chlamydia 07/03/2007   Diabetes mellitus without complication (HCC)    Fibroid    Hx gestational diabetes    Leiomyoma 07/03/2011   small   Low vitamin D level    Miscarriage    Polycystic ovaries    Postpartum care following vaginal delivery (12/18) 06/19/2015   SVT (supraventricular tachycardia) (HCC)    Vaginal bleeding during pregnancy, antepartum 01/12/2015    Past Surgical History:  Procedure Laterality Date   MOUTH SURGERY  07/02/1994   VAGINAL DELIVERY     x2    Prior to Admission medications   Medication Sig Start Date End Date Taking? Authorizing Provider  ACCU-CHEK GUIDE TEST test strip SMARTSIG:device Daily 11/24/23  Yes [provider]  fluconazole  (DIFLUCAN ) 150 MG tablet Take 150 mg by mouth every 3 (three) days. 09/27/23  Yes [provider]  LEVEMIR 100 UNIT/ML injection SMARTSIG:20 Unit(s) SUB-Q Daily 06/24/23  Yes [provider]  Na Sulfate-K Sulfate-Mg Sulfate concentrate (SUPREP) 17.5-3.13-1.6 GM/177ML SOLN SMARTSIG:Once 11/22/23  Yes [provider]  albuterol  (PROVENTIL  HFA;VENTOLIN  HFA) 108 (90 BASE) MCG/ACT inhaler Inhale 2 puffs into the lungs every 6 (six) hours as needed for wheezing or shortness of breath.    [provider]  escitalopram  (LEXAPRO ) 20 MG tablet Take 20 mg by mouth at bedtime.     [provider]  ESTARYLLA 0.25-35 MG-MCG tablet Take 1 tablet by mouth daily. 09/21/19   [provider]  FEROSUL 325 (65 Fe) MG tablet Take 325 mg by mouth in the morning and at bedtime.  Patient not taking: Reported on 11/22/2023 11/10/19   [provider]  INSULIN LISPRO, 1 UNIT DIAL, [place] Insulin Lispro    [provider]  metoprolol  tartrate (LOPRESSOR ) 25 MG tablet TAKE 1 TABLET(25 MG) BY MOUTH TWICE DAILY 10/23/21   Wendie Hamburg, MD  Multiple Vitamins-Minerals (MULTIVITAL PO) Take 1 tablet by mouth daily. Women's one A day     [provider]  omeprazole (PRILOSEC) 20 MG capsule Take 20 mg by mouth daily.    [provider]  Semaglutide, 2 MG/DOSE, (OZEMPIC, 2 MG/DOSE,) 8 MG/3ML SOPN Ozempic    [provider]  TOUJEO SOLOSTAR 300 UNIT/ML Solostar Pen Inject into the skin. 11/21/23   [provider]  Vitamin D, Ergocalciferol, (DRISDOL) 1.25 MG (50000 UNIT) CAPS capsule Take 50,000 Units by mouth every 7 (seven) days.    [provider]    Current Outpatient Medications  Medication Sig Dispense Refill   ACCU-CHEK GUIDE TEST test strip SMARTSIG:device Daily     fluconazole  (DIFLUCAN ) 150 MG tablet Take  150 mg by mouth every 3 (three) days.     LEVEMIR 100 UNIT/ML injection SMARTSIG:20 Unit(s) SUB-Q Daily     Na Sulfate-K Sulfate-Mg Sulfate concentrate (SUPREP) 17.5-3.13-1.6 GM/177ML SOLN SMARTSIG:Once     albuterol  (PROVENTIL  HFA;VENTOLIN  HFA) 108 (90 BASE) MCG/ACT inhaler Inhale 2 puffs into the lungs every 6 (six) hours as needed for wheezing or shortness of breath.     escitalopram  (LEXAPRO ) 20 MG tablet Take 20 mg by mouth  at bedtime.      ESTARYLLA 0.25-35 MG-MCG tablet Take 1 tablet by mouth daily.     FEROSUL 325 (65 Fe) MG tablet Take 325 mg by mouth in the morning and at bedtime.  (Patient not taking: Reported on 11/22/2023)     INSULIN LISPRO, 1 UNIT DIAL, [place] Insulin Lispro     metoprolol  tartrate (LOPRESSOR ) 25 MG tablet TAKE 1 TABLET(25 MG) BY MOUTH TWICE DAILY 60 tablet 1   Multiple Vitamins-Minerals (MULTIVITAL PO) Take 1 tablet by mouth daily. Women's one A day      omeprazole (PRILOSEC) 20 MG capsule Take 20 mg by mouth daily.     Semaglutide, 2 MG/DOSE, (OZEMPIC, 2 MG/DOSE,) 8 MG/3ML SOPN Ozempic     TOUJEO SOLOSTAR 300 UNIT/ML Solostar Pen Inject into the skin.     Vitamin D, Ergocalciferol, (DRISDOL) 1.25 MG (50000 UNIT) CAPS capsule Take 50,000 Units by mouth every 7 (seven) days.     Current Facility-Administered Medications  Medication Dose Route Frequency Provider Last Rate Last Admin   0.9 %  sodium chloride  infusion  500 mL Intravenous Once Daina Drum, MD        Allergies as of 12/06/2023 - Review Complete 12/06/2023  Allergen Reaction Noted   Codeine Anaphylaxis and Hives 12/04/2010   Sulfa antibiotics Itching 12/04/2010   Ciprofloxacin Rash 09/16/2012   Prednisone Hives and Other (See Comments) 12/04/2010    Family History  Problem Relation Age of Onset   Hypertension Mother    Hypertension Father        borderline   Diabetes Father    Breast cancer Paternal Aunt 83   Ovarian cancer Maternal Grandmother    Diabetes Paternal Grandmother    Colon polyps Neg Hx    Colon cancer Neg Hx    Esophageal cancer Neg Hx    Rectal cancer Neg Hx    Stomach cancer Neg Hx     Social History   Socioeconomic History   Marital status: Single    Spouse name: Not on file   Number of children: Not on file   Years of education: Not on file   Highest education level: Not on file  Occupational History   Not on file  Tobacco Use   Smoking status: Never   Smokeless tobacco: Never   Vaping Use   Vaping status: Never Used  Substance and Sexual Activity   Alcohol use: Yes    Comment: socially, not while pregnant   Drug use: No   Sexual activity: Yes    Partners: Male    Birth control/protection: None, Pill  Other Topics Concern   Not on file  Social History Narrative   Not on file   Social Drivers of Health   Financial Resource Strain: Not on file  Food Insecurity: Not on file  Transportation Needs: Not on file  Physical Activity: Not on file  Stress: Not on file  Social Connections: Not on file  Intimate Partner Violence: Not on file    Physical Exam: Vital  signs in last 24 hours: BP 120/76   Pulse 76   Temp (!) 97.5 F (36.4 C) (Temporal)   Ht 5\' 4"  (1.626 m)   Wt 252 lb (114.3 kg)   SpO2 98%   BMI 43.26 kg/m  GEN: NAD EYE: Sclerae anicteric ENT: MMM CV: Non-tachycardic Pulm: No increased work of breathing GI: Soft, NT/ND NEURO:  Alert & Oriented   Regino Caprio, MD [facility]  12/06/2023 10:41 AM

## 2023-12-06 NOTE — Progress Notes (Signed)
 Sedate, gd SR, tolerated procedure well, VSS, report to RN

## 2023-12-06 NOTE — Patient Instructions (Signed)
 Await pathology results.   Please read over handouts provided   YOU HAD AN ENDOSCOPIC PROCEDURE TODAY AT THE [facility]:   Refer to the procedure report that was given to you for any specific questions about what was found during the examination.  If the procedure report does not answer your questions, please call your gastroenterologist to clarify.  If you requested that your care partner not be given the details of your procedure findings, then the procedure report has been included in a sealed envelope for you to review at your convenience later.  YOU SHOULD EXPECT: Some feelings of bloating in the abdomen. Passage of more gas than usual.  Walking can help get rid of the air that was put into your GI tract during the procedure and reduce the bloating. If you had a lower endoscopy (such as a colonoscopy or flexible sigmoidoscopy) you may notice spotting of blood in your stool or on the toilet paper. If you underwent a bowel prep for your procedure, you may not have a normal bowel movement for a few days.  Please Note:  You might notice some irritation and congestion in your nose or some drainage.  This is from the oxygen used during your procedure.  There is no need for concern and it should clear up in a day or so.  SYMPTOMS TO REPORT IMMEDIATELY:  Following lower endoscopy (colonoscopy or flexible sigmoidoscopy):  Excessive amounts of blood in the stool  Significant tenderness or worsening of abdominal pains  Swelling of the abdomen that is new, acute  Fever of 100F or higher  For urgent or emergent issues, a gastroenterologist can be reached at any hour by calling [phone]. Do not use MyChart messaging for urgent concerns.    DIET:  We do recommend a small meal at first, but then you may proceed to your regular diet.  Drink plenty of fluids but you should avoid alcoholic beverages for 24 hours.  ACTIVITY:  You should plan to take it easy for the rest of today and  you should NOT DRIVE or use heavy machinery until tomorrow (because of the sedation medicines used during the test).    FOLLOW UP: Our staff will call the number listed on your records the next business day following your procedure.  We will call around 7:15- 8:00 am to check on you and address any questions or concerns that you may have regarding the information given to you following your procedure. If we do not reach you, we will leave a message.     If any biopsies were taken you will be contacted by phone or by letter within the next 1-3 weeks.  Please call us at [phone] if you have not heard about the biopsies in 3 weeks.    SIGNATURES/CONFIDENTIALITY: You and/or your care partner have signed paperwork which will be entered into your electronic medical record.  These signatures attest to the fact that that the information above on your After Visit Summary has been reviewed and is understood.  Full responsibility of the confidentiality of this discharge information lies with you and/or your care-partner.

## 2023-12-06 NOTE — Op Note (Signed)
 [facility] Patient Name: Marie Wang Procedure Date: 12/06/2023 11:44 AM MRN: [id number] Endoscopist: Freada Jacobs [place] , , [id number] Age: 48 Referring MD:  Date of Birth: Sep 18, 1975 Gender: Female Account #: [id number] Procedure:                Colonoscopy Indications:              Screening for colorectal malignant neoplasm, This                            is the patient's first colonoscopy Medicines:                Monitored Anesthesia Care Procedure:                Pre-Anesthesia Assessment:                           - Prior to the procedure, a History and Physical                            was performed, and patient medications and                            allergies were reviewed. The patient's tolerance of                            previous anesthesia was also reviewed. The risks                            and benefits of the procedure and the sedation                            options and risks were discussed with the patient.                            All questions were answered, and informed consent                            was obtained. Prior Anticoagulants: The patient has                            taken no anticoagulant or antiplatelet agents. ASA                            Grade Assessment: III - A patient with severe                            systemic disease. After reviewing the risks and                            benefits, the patient was deemed in satisfactory                            condition to undergo the procedure.  After obtaining informed consent, the colonoscope                            was passed under direct vision. Throughout the                            procedure, the patient's blood pressure, pulse, and                            oxygen saturations were monitored continuously. The                            CF [id number] #[id number] was introduced through the anus                            and advanced  to the the terminal ileum. The                            colonoscopy was performed without difficulty. The                            patient tolerated the procedure well. The quality                            of the bowel preparation was excellent. The                            terminal ileum, ileocecal valve, appendiceal                            orifice, and rectum were photographed. Scope In: 12:03:26 PM Scope Out: 12:16:22 PM Scope Withdrawal Time: 0 hours 11 minutes 7 seconds  Total Procedure Duration: 0 hours 12 minutes 56 seconds  Findings:                 The terminal ileum appeared normal.                           A 4 mm polyp was found in the ascending colon. The                            polyp was sessile. The polyp was removed with a                            cold snare. Resection and retrieval were complete.                           Non-bleeding internal hemorrhoids were found during                            retroflexion. Complications:            No immediate complications. Estimated Blood Loss:     Estimated blood loss was minimal. Impression:               -  The examined portion of the ileum was normal.                           - One 4 mm polyp in the ascending colon, removed                            with a cold snare. Resected and retrieved.                           - Non-bleeding internal hemorrhoids. Recommendation:           - Discharge patient to home (with escort).                           - Await pathology results.                           - The findings and recommendations were discussed                            with the patient. Dr Pedro Bourgeois "Anastacio Balm" [place],  12/06/2023 12:18:12 PM

## 2023-12-09 ENCOUNTER — Telehealth: Payer: Self-pay | Admitting: *Deleted

## 2023-12-09 NOTE — Telephone Encounter (Signed)
  Follow up Call-     12/06/2023   10:39 AM  Call back number  Post procedure Call Back phone  # [phone]  Permission to leave phone message Yes     Patient questions:  Message left to call if necessary.

## 2023-12-10 ENCOUNTER — Ambulatory Visit: Payer: Self-pay | Admitting: Internal Medicine

## 2023-12-10 LAB — SURGICAL PATHOLOGY

## 2024-01-02 DIAGNOSIS — E1165 Type 2 diabetes mellitus with hyperglycemia: Secondary | ICD-10-CM | POA: Diagnosis not present

## 2024-04-16 DIAGNOSIS — F419 Anxiety disorder, unspecified: Secondary | ICD-10-CM | POA: Diagnosis not present

## 2024-04-16 DIAGNOSIS — E119 Type 2 diabetes mellitus without complications: Secondary | ICD-10-CM | POA: Diagnosis not present

## 2024-04-16 DIAGNOSIS — J019 Acute sinusitis, unspecified: Secondary | ICD-10-CM | POA: Diagnosis not present
